# Patient Record
Sex: Female | Born: 1980 | Race: Asian | Hispanic: No | Marital: Married | Smoking: Never smoker
Health system: Southern US, Community
[De-identification: ages and names within clinical notes are randomized; demographics above are authoritative.]

## PROBLEM LIST (undated history)

## (undated) ENCOUNTER — Inpatient Hospital Stay (HOSPITAL_COMMUNITY): Payer: Self-pay

## (undated) DIAGNOSIS — E538 Deficiency of other specified B group vitamins: Secondary | ICD-10-CM

## (undated) DIAGNOSIS — E041 Nontoxic single thyroid nodule: Secondary | ICD-10-CM

## (undated) DIAGNOSIS — E559 Vitamin D deficiency, unspecified: Secondary | ICD-10-CM

## (undated) DIAGNOSIS — D219 Benign neoplasm of connective and other soft tissue, unspecified: Secondary | ICD-10-CM

## (undated) HISTORY — DX: Vitamin D deficiency, unspecified: E55.9

## (undated) HISTORY — PX: TUBAL LIGATION: SHX77

## (undated) HISTORY — DX: Deficiency of other specified B group vitamins: E53.8

## (undated) HISTORY — PX: GUM SURGERY: SHX658

---

## 2010-02-25 ENCOUNTER — Ambulatory Visit: Payer: Self-pay | Admitting: Family Medicine

## 2010-03-01 ENCOUNTER — Ambulatory Visit: Payer: Self-pay | Admitting: Family Medicine

## 2010-03-01 DIAGNOSIS — R635 Abnormal weight gain: Secondary | ICD-10-CM | POA: Insufficient documentation

## 2010-03-01 DIAGNOSIS — M25569 Pain in unspecified knee: Secondary | ICD-10-CM | POA: Insufficient documentation

## 2010-03-01 DIAGNOSIS — M2140 Flat foot [pes planus] (acquired), unspecified foot: Secondary | ICD-10-CM | POA: Insufficient documentation

## 2010-03-11 ENCOUNTER — Encounter: Payer: Self-pay | Admitting: Family Medicine

## 2010-03-11 ENCOUNTER — Ambulatory Visit: Payer: Self-pay | Admitting: Family Medicine

## 2010-03-11 LAB — CONVERTED CEMR LAB

## 2010-03-17 LAB — CONVERTED CEMR LAB
AST: 13 units/L (ref 0–37)
Albumin: 4.3 g/dL (ref 3.5–5.2)
Alkaline Phosphatase: 82 units/L (ref 39–117)
BUN: 8 mg/dL (ref 6–23)
Calcium: 9.2 mg/dL (ref 8.4–10.5)
Chloride: 102 meq/L (ref 96–112)
HDL: 53 mg/dL (ref >39)
LDL Cholesterol: 101 mg/dL — ABNORMAL HIGH (ref 0–99)
Potassium: 4.5 meq/L (ref 3.5–5.3)
Sodium: 137 meq/L (ref 135–145)
TSH: 1.657 microintl units/mL (ref 0.350–4.500)
Total Protein: 7.1 g/dL (ref 6.0–8.3)

## 2010-03-29 ENCOUNTER — Encounter: Payer: Self-pay | Admitting: Family Medicine

## 2010-04-21 NOTE — Assessment & Plan Note (Signed)
Summary: CPE with pap   Vital Signs:  Patient profile:   30 year old female Menstrual status:  regular LMP:     02/16/2010 Height:      64 inches Weight:      171 pounds BMI:     29.46 Pulse rate:   79 / minute BP sitting:   103 / 68  (right arm) Cuff size:   regular  Vitals Entered By: Avon Gully CMA, Duncan Dull) (March 11, 2010 9:34 AM) CC: CPE, and pap LMP (date): 02/16/2010     Menstrual Status regular Enter LMP: 02/16/2010   Primary Care Provider:  Seymour Bars DO  CC:  CPE and and pap.  History of Present Illness: 30 yo F presents for CPE with pap smear.  She is G1P1 with regular menses.  She is married and monogamous.  Due for fasting labs (has order from last visit).  Immunizations are UTD.  She is thinks her Tdap was done in 04.  She is due to see the dentist.  She has chosen to not take anything for birth control and would be fine if she got pregnant.  Not taking any vitamins and is not exercising.  Would like to lose wt.    Allergies: 1)  ! Pcn  Past History:  Past Medical History: Reviewed history from 03/01/2010 and no changes required. G1P1 bilat knee pain  Family History: Reviewed history from 03/01/2010 and no changes required. 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 Married to [employer].  Has a 40 yo daughter. Never smoked. Rare ETOH. No birth control. Just starting to exercise.  Review of Systems      See HPI  Physical Exam  General:  alert, well-developed, well-nourished, and well-hydrated.   Head:  normocephalic and atraumatic.   Eyes:  pupils equal, pupils round, and pupils reactive to light.   Ears:  EACs patent; TMs translucent and gray with good cone of light and bony landmarks.  Nose:  no nasal discharge.   Mouth:  Oral mucosa and oropharynx without lesions or exudates.  Teeth in good repair. Neck:   no masses.   Breasts:  No mass, nodules, thickening, tenderness, bulging, retraction, inflamation, nipple discharge or skin changes noted.   Lungs:  Normal respiratory effort, chest expands symmetrically. Lungs are clear to auscultation, no crackles or wheezes. Heart:  Normal rate and regular rhythm. S1 and S2 normal without gallop, murmur, click, rub or other extra sounds. Abdomen:  soft, non-tender, normal bowel sounds, no distention, no masses, no guarding, no hepatomegaly, and no splenomegaly.   Genitalia:  Pelvic Exam:        External: normal female genitalia without lesions or masses        Vagina: normal without lesions or masses        Cervix: normal without lesions or masses        Adnexa: normal bimanual exam without masses or fullness        Uterus: normal by palpation        Pap smear: performed Pulses:  2+ radial and pedal pulses Extremities:  no LE edema Skin:  color normal.   Cervical Nodes:  No lymphadenopathy noted Psych:  good eye contact, not anxious appearing, and not depressed appearing.     Impression & Recommendations:  Problem # 1:  ROUTINE GYNECOLOGICAL EXAMINATION (ICD-V72.31) Keeping healthy checklist for women  reviewed. BP at goal.  BMI 29= overwt. Work on [employer], increased exercise, wt loss. Add MVI + Calcium with D daily. Declined need for contraception. Thin prep pap done. Immunizations UTD. Labs today.   Patient Instructions: 1)  Update fasting labs downstairs today. 2)  Will call you next wk with pap smear and lab results.   Orders Added: 1)  Est. Patient age 30-39 [phone]

## 2010-04-21 NOTE — Assessment & Plan Note (Signed)
Summary: NOV knee pain   Vital Signs:  Patient profile:   30 year old female Height:      64 inches Weight:      172 pounds BMI:     29.63 O2 Sat:      100 % on Room air Temp:     98.4 degrees F oral Pulse rate:   74 / minute BP sitting:   112 / 78  (left arm) Cuff size:   large  Vitals Entered By: Payton Spark CMA (March 01, 2010 1:51 PM)  O2 Flow:  Room air CC: New to est. C/o knee pain and neck being swollen.    Primary Care Provider:  Seymour Bars DO  CC:  New to est. C/o knee pain and neck being swollen. Marland Kitchen  History of Present Illness: 30 yo [place] Female presents for NOV.  She is a G1P1001 with hx of bilateral knee pain and  ~2-3 UTIs / year.  She did PT in [place] last year but her knee pain did not improve.  She then saw ortho and had normal XRAYs.  Is not taking anything for pain and wants to start exercising more to get back to her pre-baby weight of 145.      Current Medications (verified): 1)  None  Allergies (verified): 1)  ! 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 Marrie dto Raghi.  Has a 60 yo daughter. Never smoked. Rare ETOH. No birth control. Just starting to exercise.  Review of Systems       no fevers/sweats/weakness, unexplained wt loss/gain, no change in vision, no difficulty hearing, ringing in ears, no hay fever/allergies, no CP/discomfort, no palpitations, no breast lump/nipple discharge, no cough/wheeze, no blood in stool, no N/V/D, no nocturia, no leaking urine, no unusual vag bleeding, no vaginal/penile discharge, + muscle/joint pain, no rash, no new/changing mole, no HA, no memory loss, no anxiety, no sleep problem, no depression, no unexplained lumps, no easy bruising/bleeding, + concern with sexual function  Physical Exam  General:  alert,  well-developed, well-nourished, well-hydrated, and overweight-appearing.   Head:  normocephalic and atraumatic.   Eyes:  pupils equal, pupils round, and pupils reactive to light.   Mouth:  pharynx pink and moist.   Neck:  no masses.  slightly enlarged thyroid gland w/o palpable nodules, tenderness or bruits - moves freely with swallowing Lungs:  Normal respiratory effort, chest expands symmetrically. Lungs are clear to auscultation, no crackles or wheezes. Heart:  Normal rate and regular rhythm. S1 and S2 normal without gallop, murmur, click, rub or other extra sounds. Msk:  no knee effusions  Extremities:  bilat pes planus with over pronation Skin:  color normal.   Psych:  good eye contact, not anxious appearing, and not depressed appearing.     Impression & Recommendations:  Problem # 1:  KNEE PAIN, BILATERAL (ICD-719.46) Likely functional knee pain, bilat but L>R due to pes planus with overpronation and medial stress on the knees. Advised treatment with OTC shoe inserts for improved arch support, proper footwear.  Ice knee after exercise. Wt loss is likely to help long term.  Will recheck at her CPE.  Problem # 2:  WEIGHT GAIN (ICD-783.1) Improving, 30 mos postpartum with pre- pregnancy wt of 145. Will check TSH with fasting labs. Plans to start  working out at [employer] Orders: T-TSH [phone])  Other Orders: T-Comprehensive Metabolic Panel [phone]) T-Lipid Profile [phone])  Patient Instructions: 1)  Try Dr Brett Canales shoe inserts for over pronators. 2)  Wear shoes with good arch support. 3)  Ice knees x 15 min after exercise. 4)  Update fasting labs downstairs. 5)  Will call you w/ results. 6)  Return for PHYSICAL with PAP smear in 3 mos.   Orders Added: 1)  T-TSH [[id number]] 2)  T-Comprehensive Metabolic Panel [[id number]] 3)  T-Lipid Profile [[id number]] 4)  New Patient Level III [88416]

## 2010-04-21 NOTE — Miscellaneous (Signed)
Summary: pap smear  Clinical Lists Changes  Observations: Added new observation of PAP SMEAR:  Specimen Adequacy: Satisfactory for evaluation.   Ancillary Testing: NO HR HPV present.  Squamous Cell: Atypical Squamous Cells of undetermined significance (ASC-US).   (03/11/2010 15:25)      Pap Smear  Procedure date:  03/11/2010  Findings:       Specimen Adequacy: Satisfactory for evaluation.   Ancillary Testing: NO HR HPV present.  Squamous Cell: Atypical Squamous Cells of undetermined significance (ASC-US).     Pap Smear  Procedure date:  03/11/2010  Findings:       Specimen Adequacy: Satisfactory for evaluation.   Ancillary Testing: NO HR HPV present.  Squamous Cell: Atypical Squamous Cells of undetermined significance (ASC-US).    Appended Document: pap smear Pls let pt know that her pap smear came back "ASCUS' - slightly abnormal but further testing was negative for the high risk HPV virus, so will plan to repeat in 1 yr.  Seymour Bars, D.O.  Appended Document: pap smear 03/29/2010 @ 3:40pm- LM to return call. KJ LPN  Appended Document: pap smear Pt aware of the above

## 2011-07-17 ENCOUNTER — Other Ambulatory Visit: Payer: Self-pay | Admitting: Endocrinology

## 2011-07-17 DIAGNOSIS — E041 Nontoxic single thyroid nodule: Secondary | ICD-10-CM

## 2011-07-25 ENCOUNTER — Inpatient Hospital Stay
Admission: RE | Admit: 2011-07-25 | Discharge: 2011-07-25 | Payer: Self-pay | Source: Ambulatory Visit | Attending: Endocrinology | Admitting: Endocrinology

## 2011-07-27 ENCOUNTER — Ambulatory Visit
Admission: RE | Admit: 2011-07-27 | Discharge: 2011-07-27 | Disposition: A | Discharge status: Home / Self Care | Payer: Managed Care, Other (non HMO) | Source: Ambulatory Visit | Attending: Endocrinology | Admitting: Endocrinology

## 2011-07-27 ENCOUNTER — Other Ambulatory Visit (HOSPITAL_COMMUNITY)
Admission: RE | Admit: 2011-07-27 | Discharge: 2011-07-27 | Disposition: A | Discharge status: Home / Self Care | Payer: Managed Care, Other (non HMO) | Source: Ambulatory Visit | Attending: Endocrinology | Admitting: Endocrinology

## 2011-07-27 DIAGNOSIS — E049 Nontoxic goiter, unspecified: Secondary | ICD-10-CM | POA: Insufficient documentation

## 2011-07-27 DIAGNOSIS — E041 Nontoxic single thyroid nodule: Secondary | ICD-10-CM

## 2011-11-02 LAB — OB RESULTS CONSOLE RPR: RPR: NONREACTIVE

## 2011-11-02 LAB — OB RESULTS CONSOLE ANTIBODY SCREEN: Antibody Screen: NEGATIVE

## 2011-11-02 LAB — OB RESULTS CONSOLE GC/CHLAMYDIA: Chlamydia: NEGATIVE

## 2012-01-11 ENCOUNTER — Encounter (HOSPITAL_COMMUNITY): Payer: Self-pay | Admitting: *Deleted

## 2012-01-11 ENCOUNTER — Inpatient Hospital Stay (HOSPITAL_COMMUNITY)
Admission: AD | Admit: 2012-01-11 | Discharge: 2012-01-12 | Disposition: A | Discharge status: Home / Self Care | Payer: Managed Care, Other (non HMO) | Source: Ambulatory Visit | Attending: Obstetrics and Gynecology | Admitting: Obstetrics and Gynecology

## 2012-01-11 DIAGNOSIS — K529 Noninfective gastroenteritis and colitis, unspecified: Secondary | ICD-10-CM

## 2012-01-11 DIAGNOSIS — R197 Diarrhea, unspecified: Secondary | ICD-10-CM | POA: Insufficient documentation

## 2012-01-11 DIAGNOSIS — O99891 Other specified diseases and conditions complicating pregnancy: Secondary | ICD-10-CM | POA: Insufficient documentation

## 2012-01-11 DIAGNOSIS — K5289 Other specified noninfective gastroenteritis and colitis: Secondary | ICD-10-CM | POA: Insufficient documentation

## 2012-01-11 DIAGNOSIS — R109 Unspecified abdominal pain: Secondary | ICD-10-CM | POA: Insufficient documentation

## 2012-01-11 LAB — URINALYSIS, ROUTINE W REFLEX MICROSCOPIC
Hgb urine dipstick: NEGATIVE
Leukocytes, UA: NEGATIVE
Nitrite: NEGATIVE
Protein, ur: NEGATIVE mg/dL
Specific Gravity, Urine: <1.005 — ABNORMAL LOW (ref 1.005–1.030)
Urobilinogen, UA: 0.2 mg/dL (ref 0.0–1.0)

## 2012-01-11 NOTE — MAU Note (Signed)
Diarrhea with mucus in the stool. Back pain and discomfort in lower abdomen.

## 2012-01-11 NOTE — MAU Provider Note (Signed)
Chief Complaint: Diarrhea and Back Pain  First Provider Initiated Contact with Patient 01/11/12 2348      SUBJECTIVE HPI: Kelly Ramsey is a 31 y.o. G2P1001 at [redacted]w[redacted]d by LMP who presents with 4 BMs w/ mucus in the past 24 hours and mild cramping. Denies fever, chills, N/V, loose or watery stools, VB, LOF. Asking if she can take Imodium.   History reviewed. No pertinent past medical history. OB History    Grav Para Term Preterm Abortions TAB SAB Ect Mult Living   2 1 1       1      # Outc Date GA Lbr Len/2nd Wgt Sex Del Anes PTL Lv   1 TRM      LTCS      Comments: Breech   2 CUR              Past Surgical History  Procedure Date  . Cesarean section   . Gum surgery    History   Social History  . Marital Status: Married    Spouse Name: N/A    Number of Children: N/A  . Years of Education: N/A   Occupational History  . Not on file.   Social History Main Topics  . Smoking status: Never Smoker   . Smokeless tobacco: Not on file  . Alcohol Use: No  . Drug Use: No  . Sexually Active: Not Currently   Other Topics Concern  . Not on file   Social History Narrative  . No narrative on file   No current facility-administered medications on file prior to encounter.   No current outpatient prescriptions on file prior to encounter.   Allergies  Allergen Reactions  . Penicillins Rash    ROS: Pertinent items in HPI  OBJECTIVE Temperature 97.9 F (36.6 C), resp. rate 18, height 5\' 4"  (1.626 m), weight 79.833 kg (176 lb). GENERAL: Well-developed, well-nourished female in no acute distress. Anxious. HEENT: Normocephalic HEART: normal rate RESP: normal effort ABDOMEN: Soft, non-tender. EXTREMITIES: Nontender, no edema NEURO: Alert and oriented SPECULUM EXAM: deferred BIMANUAL: cervix long and closed; uterus 18-week-size.  LAB RESULTS Results for orders placed during the hospital encounter of 01/11/12 (from the past 24 hour(s))  URINALYSIS, ROUTINE W REFLEX MICROSCOPIC      Status: Abnormal   Collection Time   01/11/12 11:15 PM      Component Value Range   Color, Urine YELLOW  YELLOW   APPearance CLEAR  CLEAR   Specific Gravity, Urine <1.005 (*) 1.005 - 1.030   pH 5.5  5.0 - 8.0   Glucose, UA NEGATIVE  NEGATIVE mg/dL   Hgb urine dipstick NEGATIVE  NEGATIVE   Bilirubin Urine NEGATIVE  NEGATIVE   Ketones, ur NEGATIVE  NEGATIVE mg/dL   Protein, ur NEGATIVE  NEGATIVE mg/dL   Urobilinogen, UA 0.2  0.0 - 1.0 mg/dL   Nitrite NEGATIVE  NEGATIVE   Leukocytes, UA NEGATIVE  NEGATIVE   IMAGING No results found.  MAU COURSE  ASSESSMENT 1. Gastroenteritis, acute    PLAN Discharge home per consult w Dr. Vincente Poli. May take Imodium.  Call office if no improvement in 72 hours.  Follow-up Information    Follow up with ADKINS,GRETCHEN, MD. On 01/12/2012. (as scheduled)    Contact information:   [address] [place], [address] [place] [address] [phone]       Follow up with THE [facility] [place] MATERNITY ADMISSIONS. (As needed if symptoms worsen)    Contact information:   [address] [phone] [address] [place] [address] [phone]          Medication List     As of 01/12/2012  5:10 AM    TAKE these medications         multivitamin with minerals tablet   Take 1 tablet by mouth daily.        [place], CNM 01/11/2012  11:41 PM

## 2012-01-12 DIAGNOSIS — K5289 Other specified noninfective gastroenteritis and colitis: Secondary | ICD-10-CM

## 2012-03-20 NOTE — L&D Delivery Note (Signed)
SVD of VMI at 0813 wt and pH pending; APGARs 9,9.  EBL 300cc. Head delivered LOA with mouth and nose bulb suctioned on perineum.  Body delivered atraumatically.  Cord clamped, cut and baby to abdomen.  Cord pH obtained.  Placenta delivered S/I/3VC with marginal cord insertion noted.  Fundus firmed with pitocin and massage.  2nd degree perineal laceration repaired in the normal fashion with 2-0 Rapide.  Mom and baby stable.  Baby noted to have absent left fifth digit.  Peds to evaluate.    Mitchel Honour, DO

## 2012-05-15 LAB — OB RESULTS CONSOLE GBS: GBS: POSITIVE

## 2012-05-28 ENCOUNTER — Encounter (HOSPITAL_COMMUNITY): Payer: Self-pay | Admitting: Pharmacist

## 2012-06-04 ENCOUNTER — Inpatient Hospital Stay (HOSPITAL_COMMUNITY): Payer: Managed Care, Other (non HMO)

## 2012-06-04 ENCOUNTER — Encounter (HOSPITAL_COMMUNITY): Payer: Self-pay

## 2012-06-04 ENCOUNTER — Inpatient Hospital Stay (HOSPITAL_COMMUNITY)
Admission: AD | Admit: 2012-06-04 | Discharge: 2012-06-05 | Disposition: A | Discharge status: Home / Self Care | Payer: Managed Care, Other (non HMO) | Source: Ambulatory Visit | Attending: Obstetrics and Gynecology | Admitting: Obstetrics and Gynecology

## 2012-06-04 DIAGNOSIS — O26859 Spotting complicating pregnancy, unspecified trimester: Secondary | ICD-10-CM | POA: Insufficient documentation

## 2012-06-04 HISTORY — DX: Nontoxic single thyroid nodule: E04.1

## 2012-06-04 HISTORY — DX: Benign neoplasm of connective and other soft tissue, unspecified: D21.9

## 2012-06-04 NOTE — Progress Notes (Signed)
Dr notified of patient, who present with c/o light vaginal bleeding and possibility of having placenta previa or placenta on "edge". No mention of placenta previa or low lying placenta in prenatal record. Order to obtain limited ultrasound, if no previa perform a cervical exam.

## 2012-06-07 ENCOUNTER — Encounter (HOSPITAL_COMMUNITY): Payer: Self-pay

## 2012-06-09 ENCOUNTER — Encounter (HOSPITAL_COMMUNITY): Payer: Self-pay | Admitting: Obstetrics

## 2012-06-09 ENCOUNTER — Inpatient Hospital Stay (HOSPITAL_COMMUNITY)
Admission: AD | Admit: 2012-06-09 | Discharge: 2012-06-12 | DRG: 775 | Disposition: A | Discharge status: Home / Self Care | Payer: Managed Care, Other (non HMO) | Source: Ambulatory Visit | Attending: Obstetrics and Gynecology | Admitting: Obstetrics and Gynecology

## 2012-06-09 DIAGNOSIS — O34599 Maternal care for other abnormalities of gravid uterus, unspecified trimester: Secondary | ICD-10-CM | POA: Diagnosis present

## 2012-06-09 DIAGNOSIS — E041 Nontoxic single thyroid nodule: Secondary | ICD-10-CM | POA: Diagnosis present

## 2012-06-09 DIAGNOSIS — R635 Abnormal weight gain: Secondary | ICD-10-CM

## 2012-06-09 DIAGNOSIS — D259 Leiomyoma of uterus, unspecified: Secondary | ICD-10-CM | POA: Diagnosis present

## 2012-06-09 DIAGNOSIS — E079 Disorder of thyroid, unspecified: Secondary | ICD-10-CM | POA: Diagnosis present

## 2012-06-09 DIAGNOSIS — O99284 Endocrine, nutritional and metabolic diseases complicating childbirth: Secondary | ICD-10-CM | POA: Diagnosis present

## 2012-06-09 DIAGNOSIS — O2650 Maternal hypotension syndrome, unspecified trimester: Secondary | ICD-10-CM | POA: Diagnosis not present

## 2012-06-09 DIAGNOSIS — D4959 Neoplasm of unspecified behavior of other genitourinary organ: Secondary | ICD-10-CM | POA: Diagnosis present

## 2012-06-09 DIAGNOSIS — M2140 Flat foot [pes planus] (acquired), unspecified foot: Secondary | ICD-10-CM

## 2012-06-09 DIAGNOSIS — O34219 Maternal care for unspecified type scar from previous cesarean delivery: Secondary | ICD-10-CM | POA: Diagnosis present

## 2012-06-09 LAB — TYPE AND SCREEN
ABO/RH(D): B POS
Antibody Screen: NEGATIVE

## 2012-06-09 LAB — CBC
Hemoglobin: 12 g/dL (ref 12.0–15.0)
MCV: 91.6 fL (ref 78.0–100.0)
Platelets: 177 10*3/uL (ref 150–400)
RBC: 3.91 MIL/uL (ref 3.87–5.11)
WBC: 9.5 10*3/uL (ref 4.0–10.5)

## 2012-06-09 MED ORDER — OXYCODONE-ACETAMINOPHEN 5-325 MG PO TABS: 1.0000 | ORAL_TABLET | ORAL | Status: DC | PRN

## 2012-06-09 MED ORDER — TERBUTALINE SULFATE 1 MG/ML IJ SOLN: 0.2500 mg | Freq: Once | INTRAMUSCULAR | Status: AC | PRN

## 2012-06-09 MED ORDER — FLEET ENEMA 7-19 GM/118ML RE ENEM: 1.0000 | ENEMA | RECTAL | Status: DC | PRN

## 2012-06-09 MED ORDER — ACETAMINOPHEN 325 MG PO TABS: 650.0000 mg | ORAL_TABLET | ORAL | Status: DC | PRN

## 2012-06-09 MED ORDER — LACTATED RINGERS IV SOLN: 500.0000 mL | INTRAVENOUS | Status: DC | PRN

## 2012-06-09 MED ORDER — CITRIC ACID-SODIUM CITRATE 334-500 MG/5ML PO SOLN: 30.0000 mL | ORAL | Status: DC | PRN

## 2012-06-09 MED ORDER — ONDANSETRON HCL 4 MG/2ML IJ SOLN: 4.0000 mg | Freq: Four times a day (QID) | INTRAMUSCULAR | Status: DC | PRN

## 2012-06-09 MED ORDER — OXYTOCIN BOLUS FROM INFUSION
500.0000 mL | INTRAVENOUS | Status: DC
  Administered 2012-06-10: 500 mL via INTRAVENOUS

## 2012-06-09 MED ORDER — BUTORPHANOL TARTRATE 1 MG/ML IJ SOLN
2.0000 mg | INTRAMUSCULAR | Status: DC | PRN
  Administered 2012-06-09: 2 mg via INTRAVENOUS
  Filled 2012-06-09: qty 2

## 2012-06-09 MED ORDER — OXYTOCIN 40 UNITS IN LACTATED RINGERS INFUSION - SIMPLE MED: 62.5000 mL/h | INTRAVENOUS | Status: DC

## 2012-06-09 MED ORDER — LACTATED RINGERS IV SOLN
INTRAVENOUS | Status: DC
  Administered 2012-06-09 – 2012-06-10 (×2): via INTRAVENOUS

## 2012-06-09 MED ORDER — OXYTOCIN 40 UNITS IN LACTATED RINGERS INFUSION - SIMPLE MED
1.0000 m[IU]/min | INTRAVENOUS | Status: DC
  Administered 2012-06-09: 1 m[IU]/min via INTRAVENOUS
  Administered 2012-06-10: 10 m[IU]/min via INTRAVENOUS
  Filled 2012-06-09: qty 1000

## 2012-06-09 MED ORDER — CLINDAMYCIN PHOSPHATE 900 MG/50ML IV SOLN
900.0000 mg | Freq: Three times a day (TID) | INTRAVENOUS | Status: DC
  Administered 2012-06-09 – 2012-06-10 (×2): 900 mg via INTRAVENOUS
  Filled 2012-06-09 (×3): qty 50

## 2012-06-09 MED ORDER — SODIUM CHLORIDE 0.9 % IV SOLN: 2.0000 g | Freq: Four times a day (QID) | INTRAVENOUS | Status: DC

## 2012-06-09 MED ORDER — IBUPROFEN 600 MG PO TABS: 600.0000 mg | ORAL_TABLET | Freq: Four times a day (QID) | ORAL | Status: DC | PRN

## 2012-06-09 MED ORDER — LIDOCAINE HCL (PF) 1 % IJ SOLN
30.0000 mL | INTRAMUSCULAR | Status: DC | PRN
  Filled 2012-06-09: qty 30

## 2012-06-09 NOTE — H&P (Signed)
Kelly Ramsey is a 32 y.o. female presenting for SROM about 7 pm. Some UCs. No HA, no vision change, no epigastric pain. Pregnancy complicated by marginal insertion of umbilical cord, maternal thyroid nodules without meds, uterine fibroids and history of C/S for breech.  Maternal Medical History:  Reason for admission: Rupture of membranes.   Contractions: Onset was 3-5 hours ago.   Frequency: irregular.    Fetal activity: Perceived fetal activity is normal.      OB History   Grav Para Term Preterm Abortions TAB SAB Ect Mult Living   2 1 1       1      Past Medical History  Diagnosis Date  . Fibroids   . Thyroid nodule    Past Surgical History  Procedure Laterality Date  . Cesarean section    . Gum surgery     Family History: family history is not on file. Social History:  reports that she has never smoked. She does not have any smokeless tobacco history on file. She reports that she does not drink alcohol or use illicit drugs.   Prenatal Transfer Tool  Maternal Diabetes: No Genetic Screening: Normal Maternal Ultrasounds/Referrals: Abnormal:  Findings:   Other:mild fetal renal pyelectasis Fetal Ultrasounds or other Referrals:  None Maternal Substance Abuse:  No Significant Maternal Medications:  None Significant Maternal Lab Results:  None Other Comments:  None  Review of Systems  Eyes: Negative for blurred vision.  Gastrointestinal: Negative for abdominal pain.  Neurological: Negative for headaches.    Dilation: 3.5 Effacement (%): 50 Station: -3 Exam by:: Dr Henderson Cloud Blood pressure 100/70, pulse 82, temperature 98.5 F (36.9 C), temperature source Oral, resp. rate 20, height 5\' 4"  (1.626 m), weight 200 lb (90.719 kg), SpO2 100.00%. Maternal Exam:  Uterine Assessment: Contraction strength is moderate.  Contraction frequency is irregular.   Abdomen: Surgical scars: low transverse.   Fetal presentation: vertex     Fetal Exam Fetal Monitor Review: Pattern:  accelerations present.       Physical Exam  Cardiovascular: Normal rate and regular rhythm.   Respiratory: Effort normal and breath sounds normal.  GI: There is no tenderness.  Neurological: She has normal reflexes.    Prenatal labs: ABO, Rh: --/--/B POS (03/23 2110) Antibody: NEG (03/23 2110) Rubella: Immune (08/15 0000) RPR: Nonreactive (08/15 0000)  HBsAg: Negative (08/15 0000)  HIV: Non-reactive (08/15 0000)  GBS: Positive (02/26 0000)   Assessment/Plan: 32 yo G2P1 at 64 6/7 weeks with SROM and hx of C/S for breech. D/W patient and husband risks of VBAC including uterine scar separation, life threatening bleeding for mother or baby, transfusion of blood products-HIV/Hep, hysterectomy. D/W risks of C/S including infection, bleeding/transfusion-HIV/Hep, organ damage, DVT/PE, pneumonia.  All questions answered. Will begin low dose pitocin-D/W risks   Loucille Takach II,Traniece Boffa E 06/09/2012, 10:41 PM

## 2012-06-09 NOTE — MAU Note (Signed)
Note small amt of blood tinged fluid leaking out-unable to see a fern

## 2012-06-09 NOTE — MAU Note (Signed)
Pt states she had a gush of fluid at 1900-blood tinged in color-not leaking at present

## 2012-06-10 ENCOUNTER — Inpatient Hospital Stay (HOSPITAL_COMMUNITY)
Admission: RE | Admit: 2012-06-10 | Discharge: 2012-06-10 | Disposition: A | Discharge status: Home / Self Care | Payer: Managed Care, Other (non HMO) | Source: Ambulatory Visit

## 2012-06-10 ENCOUNTER — Inpatient Hospital Stay (HOSPITAL_COMMUNITY): Payer: Managed Care, Other (non HMO) | Admitting: Anesthesiology

## 2012-06-10 ENCOUNTER — Encounter (HOSPITAL_COMMUNITY): Payer: Self-pay | Admitting: Anesthesiology

## 2012-06-10 LAB — ABO/RH: ABO/RH(D): B POS

## 2012-06-10 LAB — RPR: RPR Ser Ql: NONREACTIVE

## 2012-06-10 MED ORDER — SENNOSIDES-DOCUSATE SODIUM 8.6-50 MG PO TABS
2.0000 | ORAL_TABLET | Freq: Every day | ORAL | Status: DC
  Administered 2012-06-10 – 2012-06-11 (×2): 2 via ORAL

## 2012-06-10 MED ORDER — LIDOCAINE HCL (PF) 1 % IJ SOLN
INTRAMUSCULAR | Status: DC | PRN
  Administered 2012-06-10 (×2): 4 mL

## 2012-06-10 MED ORDER — DIPHENHYDRAMINE HCL 25 MG PO CAPS: 25.0000 mg | ORAL_CAPSULE | Freq: Four times a day (QID) | ORAL | Status: DC | PRN

## 2012-06-10 MED ORDER — LANOLIN HYDROUS EX OINT: TOPICAL_OINTMENT | CUTANEOUS | Status: DC | PRN

## 2012-06-10 MED ORDER — DIPHENHYDRAMINE HCL 50 MG/ML IJ SOLN: 12.5000 mg | INTRAMUSCULAR | Status: DC | PRN

## 2012-06-10 MED ORDER — EPHEDRINE 5 MG/ML INJ
10.0000 mg | INTRAVENOUS | Status: DC | PRN
  Filled 2012-06-10: qty 2

## 2012-06-10 MED ORDER — PHENYLEPHRINE 40 MCG/ML (10ML) SYRINGE FOR IV PUSH (FOR BLOOD PRESSURE SUPPORT)
80.0000 ug | PREFILLED_SYRINGE | INTRAVENOUS | Status: DC | PRN
  Filled 2012-06-10: qty 2

## 2012-06-10 MED ORDER — DIBUCAINE 1 % RE OINT
1.0000 "application " | TOPICAL_OINTMENT | RECTAL | Status: DC | PRN
  Administered 2012-06-11: 1 via RECTAL
  Filled 2012-06-10: qty 28

## 2012-06-10 MED ORDER — OXYCODONE-ACETAMINOPHEN 5-325 MG PO TABS
1.0000 | ORAL_TABLET | ORAL | Status: DC | PRN
  Administered 2012-06-11 (×2): 1 via ORAL
  Filled 2012-06-10 (×2): qty 1
  Filled 2012-06-10: qty 2

## 2012-06-10 MED ORDER — PRENATAL MULTIVITAMIN CH
1.0000 | ORAL_TABLET | Freq: Every day | ORAL | Status: DC
  Administered 2012-06-10 – 2012-06-11 (×2): 1 via ORAL
  Filled 2012-06-10 (×2): qty 1

## 2012-06-10 MED ORDER — PHENYLEPHRINE 40 MCG/ML (10ML) SYRINGE FOR IV PUSH (FOR BLOOD PRESSURE SUPPORT)
80.0000 ug | PREFILLED_SYRINGE | INTRAVENOUS | Status: DC | PRN
  Filled 2012-06-10: qty 2
  Filled 2012-06-10: qty 5

## 2012-06-10 MED ORDER — ONDANSETRON HCL 4 MG/2ML IJ SOLN: 4.0000 mg | INTRAMUSCULAR | Status: DC | PRN

## 2012-06-10 MED ORDER — FENTANYL 2.5 MCG/ML BUPIVACAINE 1/10 % EPIDURAL INFUSION (WH - ANES)
14.0000 mL/h | INTRAMUSCULAR | Status: DC | PRN
  Filled 2012-06-10 (×2): qty 125

## 2012-06-10 MED ORDER — IBUPROFEN 600 MG PO TABS
600.0000 mg | ORAL_TABLET | Freq: Four times a day (QID) | ORAL | Status: DC
  Administered 2012-06-10 – 2012-06-12 (×8): 600 mg via ORAL
  Filled 2012-06-10 (×8): qty 1

## 2012-06-10 MED ORDER — BENZOCAINE-MENTHOL 20-0.5 % EX AERO
1.0000 "application " | INHALATION_SPRAY | CUTANEOUS | Status: DC | PRN
  Administered 2012-06-10 – 2012-06-12 (×2): 1 via TOPICAL
  Filled 2012-06-10 (×2): qty 56

## 2012-06-10 MED ORDER — FENTANYL 2.5 MCG/ML BUPIVACAINE 1/10 % EPIDURAL INFUSION (WH - ANES)
INTRAMUSCULAR | Status: DC | PRN
  Administered 2012-06-10: 14 mL/h via EPIDURAL

## 2012-06-10 MED ORDER — LACTATED RINGERS IV SOLN
500.0000 mL | Freq: Once | INTRAVENOUS | Status: AC
  Administered 2012-06-10: 500 mL via INTRAVENOUS

## 2012-06-10 MED ORDER — ONDANSETRON HCL 4 MG PO TABS: 4.0000 mg | ORAL_TABLET | ORAL | Status: DC | PRN

## 2012-06-10 MED ORDER — WITCH HAZEL-GLYCERIN EX PADS
1.0000 "application " | MEDICATED_PAD | CUTANEOUS | Status: DC | PRN
  Administered 2012-06-11 – 2012-06-12 (×2): 1 via TOPICAL

## 2012-06-10 MED ORDER — FENTANYL 2.5 MCG/ML BUPIVACAINE 1/10 % EPIDURAL INFUSION (WH - ANES)
14.0000 mL/h | INTRAMUSCULAR | Status: DC | PRN
  Administered 2012-06-10 (×2): 14 mL/h via EPIDURAL

## 2012-06-10 MED ORDER — EPHEDRINE 5 MG/ML INJ
10.0000 mg | INTRAVENOUS | Status: DC | PRN
  Filled 2012-06-10: qty 4
  Filled 2012-06-10: qty 2

## 2012-06-10 MED ORDER — SIMETHICONE 80 MG PO CHEW: 80.0000 mg | CHEWABLE_TABLET | ORAL | Status: DC | PRN

## 2012-06-10 MED ORDER — TETANUS-DIPHTH-ACELL PERTUSSIS 5-2.5-18.5 LF-MCG/0.5 IM SUSP: 0.5000 mL | Freq: Once | INTRAMUSCULAR | Status: DC

## 2012-06-10 MED ORDER — ZOLPIDEM TARTRATE 5 MG PO TABS: 5.0000 mg | ORAL_TABLET | Freq: Every evening | ORAL | Status: DC | PRN

## 2012-06-10 NOTE — Anesthesia Preprocedure Evaluation (Signed)
Anesthesia Evaluation  Patient identified by MRN, date of birth, ID band Patient awake    Reviewed: Allergy & Precautions, H&P , Patient's Chart, lab work & pertinent test results  Airway Mallampati: III TM Distance: >3 FB Neck ROM: Full    Dental no notable dental hx. (+) Teeth Intact   Pulmonary neg pulmonary ROS,  breath sounds clear to auscultation  Pulmonary exam normal       Cardiovascular negative cardio ROS  Rhythm:Regular Rate:Normal     Neuro/Psych negative neurological ROS  negative psych ROS   GI/Hepatic negative GI ROS, Neg liver ROS,   Endo/Other  Thyroid nodule  Renal/GU negative Renal ROS  negative genitourinary   Musculoskeletal   Abdominal (+) + obese,   Peds  Hematology negative hematology ROS (+)   Anesthesia Other Findings   Reproductive/Obstetrics (+) Pregnancy Previous C/Section Fibroid uterus                           Anesthesia Physical Anesthesia Plan  ASA: II  Anesthesia Plan: Epidural   Post-op Pain Management:    Induction:   Airway Management Planned: Natural Airway  Additional Equipment:   Intra-op Plan:   Post-operative Plan:   Informed Consent: I have reviewed the patients History and Physical, chart, labs and discussed the procedure including the risks, benefits and alternatives for the proposed anesthesia with the patient or authorized representative who has indicated his/her understanding and acceptance.   Dental advisory given  Plan Discussed with: CRNA, Surgeon and Anesthesiologist  Anesthesia Plan Comments:         Anesthesia Quick Evaluation

## 2012-06-10 NOTE — Anesthesia Procedure Notes (Signed)
Epidural Patient location during procedure: OB Start time: 06/10/2012 12:41 AM  Staffing Anesthesiologist: Elodia Haviland A. Performed by: anesthesiologist   Preanesthetic Checklist Completed: patient identified, site marked, surgical consent, pre-op evaluation, timeout performed, IV checked, risks and benefits discussed and monitors and equipment checked  Epidural Patient position: sitting Prep: site prepped and draped and DuraPrep Patient monitoring: continuous pulse ox and blood pressure Approach: midline Injection technique: LOR air  Needle:  Needle type: Tuohy  Needle gauge: 17 G Needle length: 9 cm and 9 Needle insertion depth: 5 cm cm Catheter type: closed end flexible Catheter size: 19 Gauge Catheter at skin depth: 10 cm Test dose: negative and Other  Assessment Events: blood not aspirated, injection not painful, no injection resistance, negative IV test and no paresthesia  Additional Notes Patient identified. Risks and benefits discussed including failed block, incomplete  Pain control, post dural puncture headache, nerve damage, paralysis, blood pressure Changes, nausea, vomiting, reactions to medications-both toxic and allergic and post Partum back pain. All questions were answered. Patient expressed understanding and wished to proceed. Sterile technique was used throughout procedure. Epidural site was Dressed with sterile barrier dressing. No paresthesias, signs of intravascular injection Or signs of intrathecal spread were encountered.  Patient was more comfortable after the epidural was dosed. Please see RN's note for documentation of vital signs and FHR which are stable.

## 2012-06-10 NOTE — Anesthesia Postprocedure Evaluation (Signed)
  Anesthesia Post-op Note  Patient: Kelly Ramsey  Procedure(s) Performed: * No procedures listed *  Patient Location: PACU and Mother/Baby  Anesthesia Type:Epidural  Level of Consciousness: awake, alert  and oriented  Airway and Oxygen Therapy: Patient Spontanous Breathing  Post-op Pain: none  Post-op Assessment: Post-op Vital signs reviewed, Patient's Cardiovascular Status Stable, No headache, No backache, No residual numbness and No residual motor weakness  Post-op Vital Signs: Reviewed and stable  Complications: No apparent anesthesia complications

## 2012-06-10 NOTE — Progress Notes (Signed)
Up to bathroom with RN assist on stedy. Pt fainted on toilet while trying to void. Came to with ammonia and able to void. Back to bed on stedy and felt faint and nauseous with dry heaves on way back to bed. Vital sign and bleeding WNL. Feeling better and sitting up eating lunch. Instructed to not get out of bed without assist from staff.

## 2012-06-11 LAB — CBC
Platelets: 137 10*3/uL — ABNORMAL LOW (ref 150–400)
RBC: 3.35 MIL/uL — ABNORMAL LOW (ref 3.87–5.11)
WBC: 11.4 10*3/uL — ABNORMAL HIGH (ref 4.0–10.5)

## 2012-06-11 NOTE — Progress Notes (Signed)
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 rate 18, height 5\' 4"  (1.626 m), weight 90.719 kg (200 lb), SpO2 100.00%, unknown if currently breastfeeding.  Physical Exam:  General: alert, cooperative, appears stated age and no distress Lochia: appropriate Uterine Fundus: firm Incision:  DVT Evaluation: No evidence of DVT seen on physical exam.   Recent Labs  06/09/12 2110 06/11/12 0615  HGB 12.0 10.2*  HCT 35.8* 31.3*    Assessment/Plan: Plan for discharge tomorrow, Breastfeeding and Circumcision prior to discharge   LOS: 2 days   Kelly Ramsey C 06/11/2012, 10:10 AM

## 2012-06-12 ENCOUNTER — Encounter (HOSPITAL_COMMUNITY): Admission: RE | Payer: Self-pay | Source: Ambulatory Visit

## 2012-06-12 ENCOUNTER — Inpatient Hospital Stay (HOSPITAL_COMMUNITY)
Admission: RE | Admit: 2012-06-12 | Payer: Managed Care, Other (non HMO) | Source: Ambulatory Visit | Admitting: Obstetrics and Gynecology

## 2012-06-12 SURGERY — Surgical Case
Anesthesia: Regional

## 2012-06-12 MED ORDER — HYDROCORTISONE ACE-PRAMOXINE 1-1 % RE FOAM: 1.0000 | Freq: Two times a day (BID) | RECTAL | Status: DC

## 2012-06-12 MED ORDER — IBUPROFEN 600 MG PO TABS: 600.0000 mg | ORAL_TABLET | Freq: Four times a day (QID) | ORAL | Status: DC

## 2012-06-12 NOTE — Progress Notes (Signed)
Post Partum Day 2 Subjective: no complaints  Objective: Blood pressure 94/62, pulse 71, temperature 97.9 F (36.6 C), temperature source Oral, resp. rate 18, height 5\' 4"  (1.626 m), weight 90.719 kg (200 lb), SpO2 100.00%, unknown if currently breastfeeding.  Physical Exam:  General: alert, cooperative and appears stated age Lochia: appropriate Uterine Fundus: firm Incision: healing well DVT Evaluation: No evidence of DVT seen on physical exam.   Recent Labs  06/09/12 2110 06/11/12 0615  HGB 12.0 10.2*  HCT 35.8* 31.3*    Assessment/Plan: Discharge home and Breastfeeding   LOS: 3 days   Kelly Ramsey L 06/12/2012, 8:07 AM

## 2012-06-12 NOTE — Discharge Summary (Signed)
Obstetric Discharge Summary Reason for Admission: onset of labor Prenatal Procedures: none Intrapartum Procedures: spontaneous vaginal delivery Postpartum Procedures: none Complications-Operative and Postpartum: 2nd degree perineal laceration Hemoglobin  Date Value Range Status  06/11/2012 10.2* 12.0 - 15.0 g/dL Final     HCT  Date Value Range Status  06/11/2012 31.3* 36.0 - 46.0 % Final    Physical Exam:  General: alert Lochia: appropriate Uterine Fundus: firm Incision: healing well DVT Evaluation: No evidence of DVT seen on physical exam.  Discharge Diagnoses: Term Pregnancy-delivered  Discharge Information: Date: 06/12/2012 Activity: pelvic rest Diet: routine Medications: Ibuprofen Condition: improved Instructions: refer to practice specific booklet Discharge to: home   Newborn Data: Live born female  Birth Weight: 8 lb 9.7 oz (3904 g) APGAR: 9, 9  Home with mother.  Kelly Ramsey L 06/12/2012, 8:07 AM

## 2012-06-27 ENCOUNTER — Other Ambulatory Visit: Payer: Self-pay | Admitting: Endocrinology

## 2012-06-27 DIAGNOSIS — E049 Nontoxic goiter, unspecified: Secondary | ICD-10-CM

## 2012-06-28 ENCOUNTER — Ambulatory Visit
Admission: RE | Admit: 2012-06-28 | Discharge: 2012-06-28 | Disposition: A | Discharge status: Home / Self Care | Payer: Managed Care, Other (non HMO) | Source: Ambulatory Visit | Attending: Endocrinology | Admitting: Endocrinology

## 2012-06-28 DIAGNOSIS — E049 Nontoxic goiter, unspecified: Secondary | ICD-10-CM

## 2012-07-15 ENCOUNTER — Other Ambulatory Visit: Payer: Self-pay | Admitting: Endocrinology

## 2012-07-15 DIAGNOSIS — E049 Nontoxic goiter, unspecified: Secondary | ICD-10-CM

## 2013-01-03 ENCOUNTER — Other Ambulatory Visit: Payer: Managed Care, Other (non HMO)

## 2013-01-09 ENCOUNTER — Other Ambulatory Visit: Payer: Managed Care, Other (non HMO)

## 2013-01-17 ENCOUNTER — Ambulatory Visit
Admission: RE | Admit: 2013-01-17 | Discharge: 2013-01-17 | Disposition: A | Discharge status: Home / Self Care | Payer: Managed Care, Other (non HMO) | Source: Ambulatory Visit | Attending: Endocrinology | Admitting: Endocrinology

## 2013-01-17 DIAGNOSIS — E049 Nontoxic goiter, unspecified: Secondary | ICD-10-CM

## 2013-06-26 ENCOUNTER — Other Ambulatory Visit: Payer: Self-pay | Admitting: Endocrinology

## 2013-06-26 DIAGNOSIS — E049 Nontoxic goiter, unspecified: Secondary | ICD-10-CM

## 2013-12-22 ENCOUNTER — Ambulatory Visit
Admission: RE | Admit: 2013-12-22 | Discharge: 2013-12-22 | Disposition: A | Discharge status: Home / Self Care | Payer: Managed Care, Other (non HMO) | Source: Ambulatory Visit | Attending: Endocrinology | Admitting: Endocrinology

## 2013-12-22 DIAGNOSIS — E049 Nontoxic goiter, unspecified: Secondary | ICD-10-CM

## 2014-01-19 ENCOUNTER — Encounter (HOSPITAL_COMMUNITY): Payer: Self-pay | Admitting: Anesthesiology

## 2015-04-30 ENCOUNTER — Encounter (HOSPITAL_COMMUNITY): Payer: Self-pay

## 2015-04-30 ENCOUNTER — Ambulatory Visit (HOSPITAL_COMMUNITY)
Admission: RE | Admit: 2015-04-30 | Discharge: 2015-04-30 | Disposition: A | Discharge status: Home / Self Care | Payer: Managed Care, Other (non HMO) | Source: Ambulatory Visit | Attending: Obstetrics & Gynecology | Admitting: Obstetrics & Gynecology

## 2015-04-30 DIAGNOSIS — Z0371 Encounter for suspected problem with amniotic cavity and membrane ruled out: Secondary | ICD-10-CM | POA: Diagnosis not present

## 2015-04-30 DIAGNOSIS — O26892 Other specified pregnancy related conditions, second trimester: Secondary | ICD-10-CM | POA: Insufficient documentation

## 2015-04-30 DIAGNOSIS — Z3A17 17 weeks gestation of pregnancy: Secondary | ICD-10-CM | POA: Diagnosis not present

## 2015-04-30 DIAGNOSIS — O09522 Supervision of elderly multigravida, second trimester: Secondary | ICD-10-CM | POA: Insufficient documentation

## 2015-04-30 NOTE — Consult Note (Signed)
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  She had a normal anatomy ultrasound and this finding was unexpected.  No other abnormalities were noted.  This pregnancy thus far has been uncomplicated - she underwent cell-free fetal DNA (Panorama) for advanced maternal age that was low risk for aneuploidy.  She is without complaints today.  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 Fibroids   . Thyroid nodule     PSH:  Past Surgical History  Procedure Laterality Date  . Cesarean section    . Gum surgery     Meds:  Current Outpatient Prescriptions on File Prior to Encounter  Medication Sig Dispense Refill  . Prenatal Vit-Fe Fumarate-FA (PRENATAL MULTIVITAMIN) TABS Take 1 tablet by mouth at bedtime.    . hydrocortisone-pramoxine (PROCTOFOAM HC) rectal foam Place 1 applicator rectally 2 (two) times daily. (Patient not taking: Reported on 04/30/2015) 10 g 0  . ibuprofen (ADVIL,MOTRIN) 600 MG tablet Take 1 tablet (600 mg total) by mouth every 6 (six) hours. (Patient not taking: Reported on 04/30/2015) 30 tablet 0   No current facility-administered medications on file prior to encounter.   Allergies:  Allergies  Allergen  Reactions  . Penicillins Rash   FH: History reviewed. No pertinent family history.   Soc:  Social History   Social History  . Marital Status: Married    Spouse Name: N/A  . Number of Children: N/A  . Years of Education: N/A   Occupational History  . Not on file.   Social History Main Topics  . Smoking status: Never Smoker   . Smokeless tobacco: Not on file  . Alcohol Use: No  . Drug Use: No  . Sexual Activity: Not Currently   Other Topics Concern  . Not on file   Social History Narrative    Review of Systems: no vaginal bleeding or cramping/contractions, no LOF, no nausea/vomiting. All other systems reviewed and are negative.  PE:   Filed Vitals:   04/30/15 1340  BP: 102/65  Pulse: 80    A/P: 1) Single IUP at 17w 1d  2) History of amniotic band syndrome - we briefly discussed what is thought to be the etiology of amniotic band syndrome.  The recurrence rate for amniotic band syndrome is low (likely < 1%) and is thought to occur with injury to the amnion and not chorion that can stick and wrap around appendages and possibly result in amputations or other abnormalities (cleft lip/ palate, abdominal wall defects).  There do not appear to be any obstetrical risks factors that are associated with amniotic band syndrome, but cleft foot is frequently found in  this disorder.  In the event that an amniotic band is encountered with potential for amputations, fetal surgical intervention can be offered as select centers.  Recommend detailed ultrasound - with particular attention to the extremities and appendages.  Kelly Ramsey is in the process of transferring care to a [facility] facility.  Due to insurance issues, we have tentatively scheduled a detailed ultrasound a [facility] in 1-2 weeks for further evaluation.   Thank you for the opportunity to be a part of the care of Kelly Ramsey. Please contact our office if we can be of further assistance.    I spent approximately 30 minutes with this patient with over 50% of time spent in face-to-face counseling.  Benjaman Lobe, MD Maternal Fetal Medicine

## 2015-05-10 ENCOUNTER — Other Ambulatory Visit (HOSPITAL_COMMUNITY): Payer: Self-pay

## 2015-05-10 ENCOUNTER — Encounter (HOSPITAL_COMMUNITY): Payer: Self-pay

## 2016-02-08 IMAGING — US US SOFT TISSUE HEAD/NECK
1 series · 13 of 25 positions shown · non-contrast
Comparison: Prior thyroid ultrasound 01/17/2013

CLINICAL DATA: 33-year-old female with known bilateral thyroid
nodules. The dominant right thyroid nodule was previously biopsied

EXAM:
THYROID ULTRASOUND
TECHNIQUE: Ultrasound examination of the thyroid gland and adjacent soft
tissues was performed.

[Series 1: us soft tissue head/neck · 0.08mm/px · 13 of 54 slices shown]
[im 1/54]
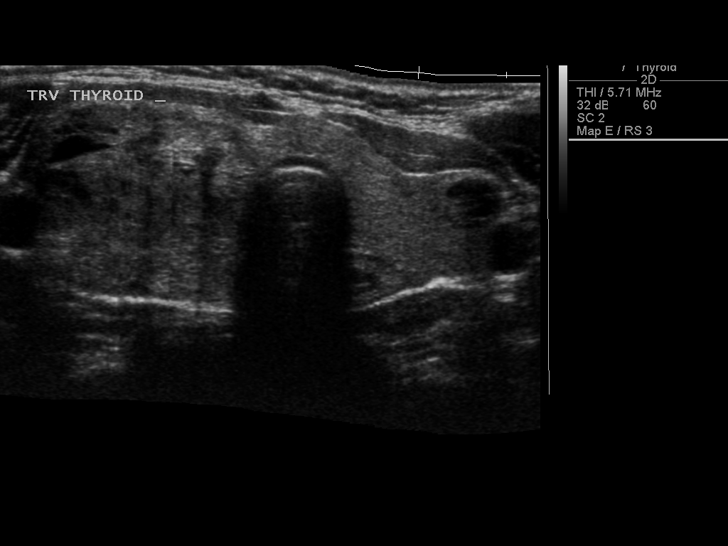
[im 5/54]
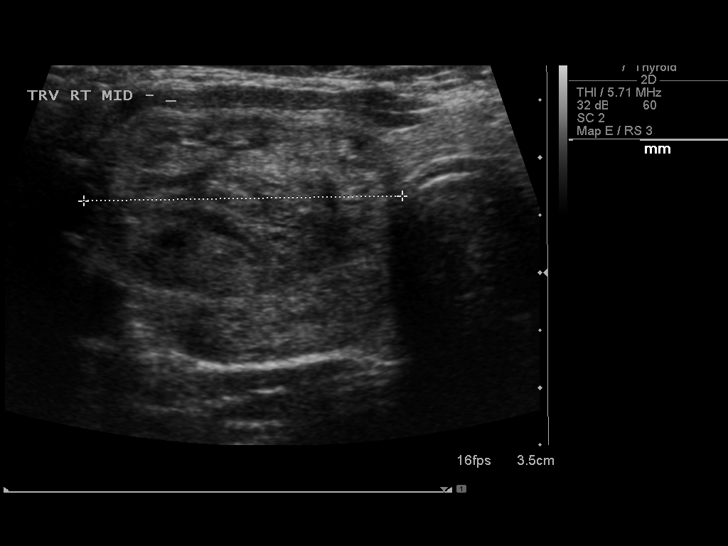
[im 9/54]
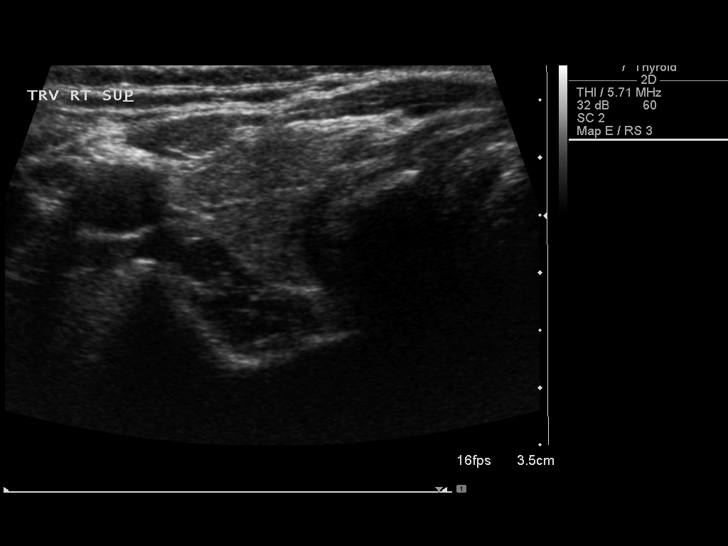
[im 14/54]
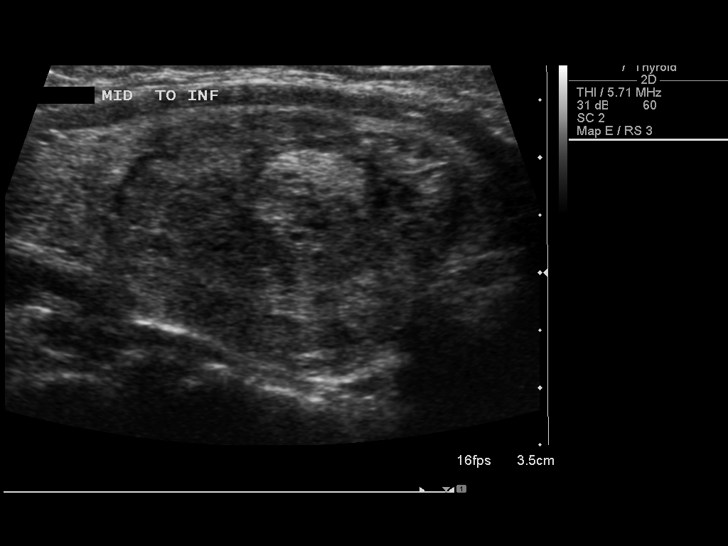
[im 18/54]
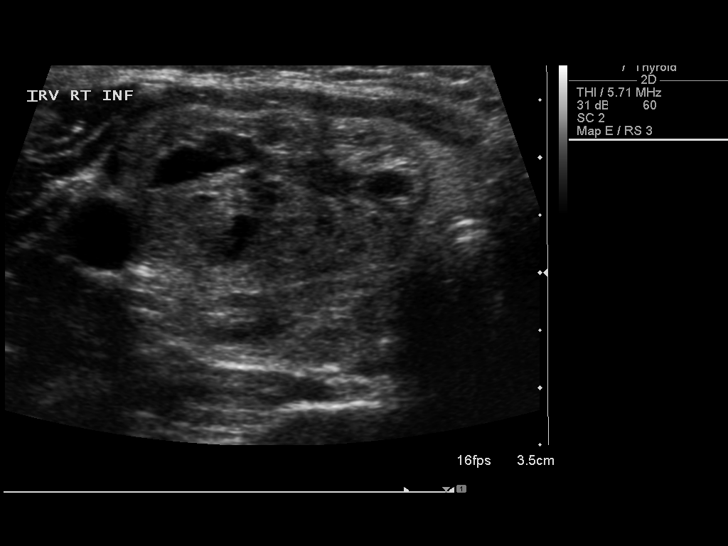
[im 23/54]
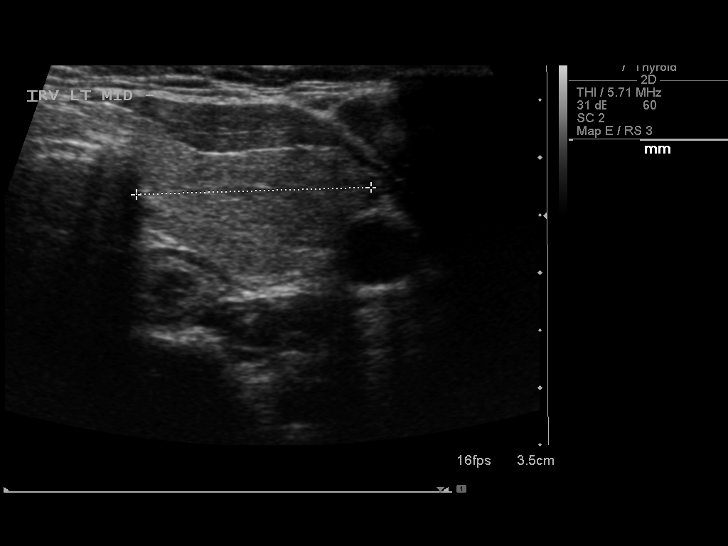
[im 27/54]
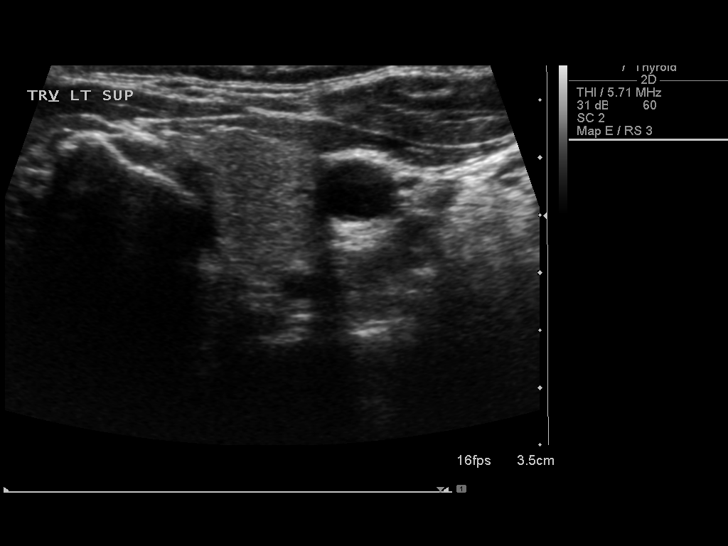
[im 31/54]
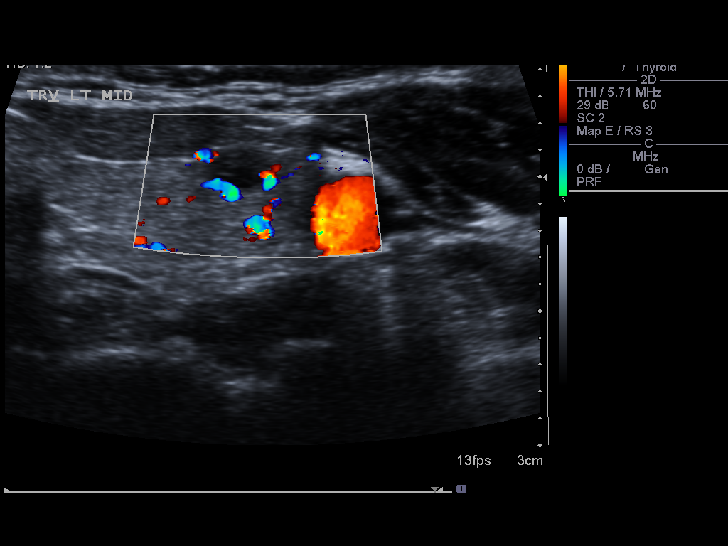
[im 36/54]
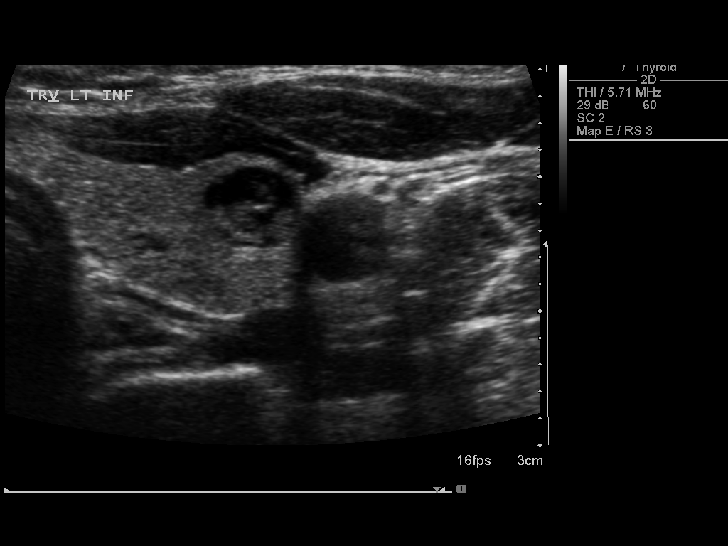
[im 40/54]
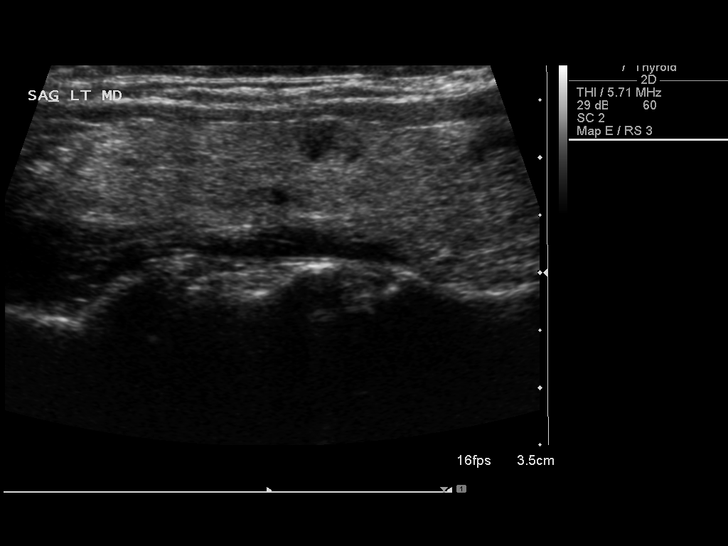
[im 45/54]
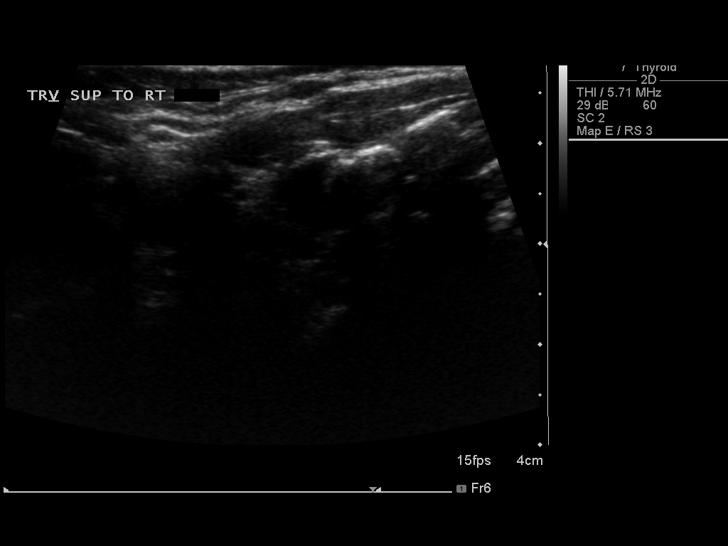
[im 49/54]
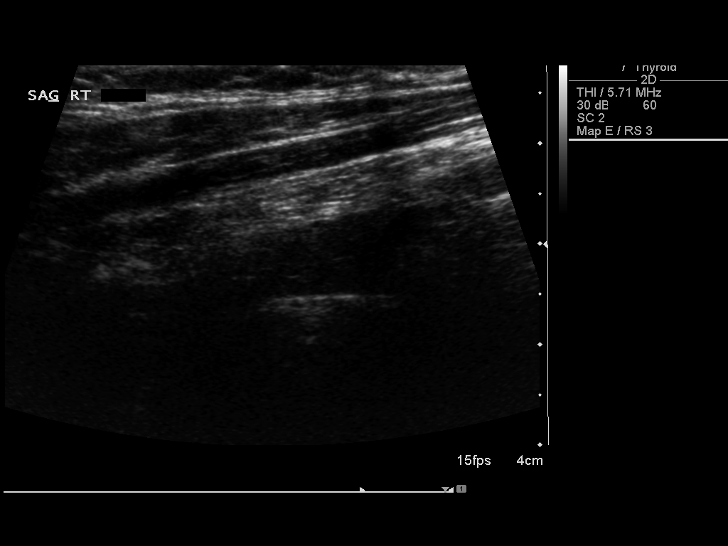
[im 54/54]
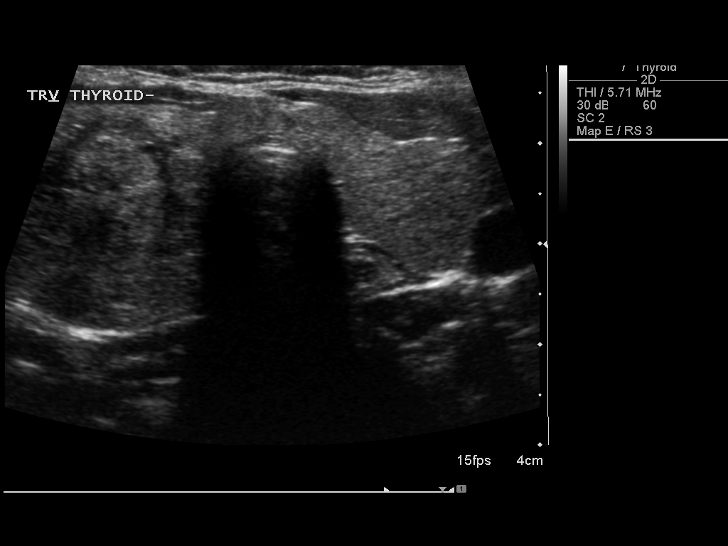

[13 of 25 positions shown; findings below may reference images not displayed]

FINDINGS: Right thyroid lobe

Measurements: 6.2 x 2.4 x 2.8 cm. Dominant solid heterogeneous
nodule the he of the majority of the lower lobe measures 3.4 x 2.3 x
2.7 cm which is slightly smaller compared to 3.4 x 2.5 x 3.6 cm as
previously measured. Size decreased likely due to involution of the
cystic components of the nodule.

Left thyroid lobe

Measurements: 5.2 x 1.3 x 2.0 cm. Previously identified small 4 mm
hypoechoic nodule in the superficial aspect of the mid gland has a
slightly enlarged to 7 mm. The enlargement is largely due to an
increased cystic component. More inferiorly, a 7 mm mixed cystic and
solid nodule is in significantly changed.

Isthmus

Thickness: 0.6 cm.  No nodules visualized.

Lymphadenopathy

None visualized.
IMPRESSION: 1. Slight interval decrease in size of the previously biopsied
dominant solid heterogeneous nodule in the lower right gland. The
interval size decrease is likely due to involution of the previously
identified cystic components.
2. Slight interval enlargement of a small mixed cystic and solid
nodule in the mid aspect of the left gland. Previously, the nodule
measured 4 mm. Today it measures 7 mm. The interval enlargement is
predominantly due to a slightly greater cystic component.
Technically speaking, an enlarging nodule meets consensus criteria
for biopsy. However, this nodule remains very small and the interval
size enlargement is largely due to an increase in the cystic
component. Consider followup thyroid ultrasound in 6 - 12 months.

## 2016-03-04 ENCOUNTER — Encounter (HOSPITAL_COMMUNITY): Payer: Self-pay

## 2016-06-02 ENCOUNTER — Other Ambulatory Visit: Payer: Self-pay | Admitting: Endocrinology

## 2016-06-02 DIAGNOSIS — E049 Nontoxic goiter, unspecified: Secondary | ICD-10-CM

## 2016-06-13 ENCOUNTER — Other Ambulatory Visit: Payer: Managed Care, Other (non HMO)

## 2017-05-31 DIAGNOSIS — E049 Nontoxic goiter, unspecified: Secondary | ICD-10-CM | POA: Diagnosis not present

## 2017-06-01 ENCOUNTER — Other Ambulatory Visit: Payer: Self-pay | Admitting: Endocrinology

## 2017-06-01 DIAGNOSIS — E049 Nontoxic goiter, unspecified: Secondary | ICD-10-CM

## 2017-06-12 ENCOUNTER — Ambulatory Visit
Admission: RE | Admit: 2017-06-12 | Discharge: 2017-06-12 | Disposition: A | Discharge status: Home / Self Care | Payer: BLUE CROSS/BLUE SHIELD | Source: Ambulatory Visit | Attending: Endocrinology | Admitting: Endocrinology

## 2017-06-12 DIAGNOSIS — E041 Nontoxic single thyroid nodule: Secondary | ICD-10-CM | POA: Diagnosis not present

## 2017-06-12 DIAGNOSIS — E049 Nontoxic goiter, unspecified: Secondary | ICD-10-CM

## 2017-07-20 DIAGNOSIS — R300 Dysuria: Secondary | ICD-10-CM | POA: Diagnosis not present

## 2017-07-20 DIAGNOSIS — M549 Dorsalgia, unspecified: Secondary | ICD-10-CM | POA: Diagnosis not present

## 2018-03-01 DIAGNOSIS — J069 Acute upper respiratory infection, unspecified: Secondary | ICD-10-CM | POA: Diagnosis not present

## 2018-03-22 DIAGNOSIS — Y999 Unspecified external cause status: Secondary | ICD-10-CM | POA: Diagnosis not present

## 2018-03-22 DIAGNOSIS — S59901A Unspecified injury of right elbow, initial encounter: Secondary | ICD-10-CM | POA: Diagnosis not present

## 2018-03-22 DIAGNOSIS — S5001XA Contusion of right elbow, initial encounter: Secondary | ICD-10-CM | POA: Diagnosis not present

## 2018-03-22 DIAGNOSIS — M25521 Pain in right elbow: Secondary | ICD-10-CM | POA: Diagnosis not present

## 2018-03-22 DIAGNOSIS — W010XXA Fall on same level from slipping, tripping and stumbling without subsequent striking against object, initial encounter: Secondary | ICD-10-CM | POA: Diagnosis not present

## 2018-11-29 ENCOUNTER — Other Ambulatory Visit: Payer: Self-pay | Admitting: Endocrinology

## 2018-11-29 DIAGNOSIS — E049 Nontoxic goiter, unspecified: Secondary | ICD-10-CM

## 2018-12-10 ENCOUNTER — Ambulatory Visit
Admission: RE | Admit: 2018-12-10 | Discharge: 2018-12-10 | Disposition: A | Discharge status: Home / Self Care | Payer: BLUE CROSS/BLUE SHIELD | Source: Ambulatory Visit | Attending: Endocrinology | Admitting: Endocrinology

## 2018-12-10 DIAGNOSIS — E049 Nontoxic goiter, unspecified: Secondary | ICD-10-CM

## 2019-11-24 IMAGING — US US THYROID
1 series · 13 of 25 positions shown · non-contrast
Comparison: 12/22/2013 and 06/28/2012

CLINICAL DATA: Multinodular goiter. Dominant right thyroid nodule
was biopsied on July 27, 2011.

EXAM:
THYROID ULTRASOUND
TECHNIQUE: Ultrasound examination of the thyroid gland and adjacent soft
tissues was performed.

[Series 1: us thyroid · 0.06mm/px · 13 of 51 slices shown]
[im 1/51]
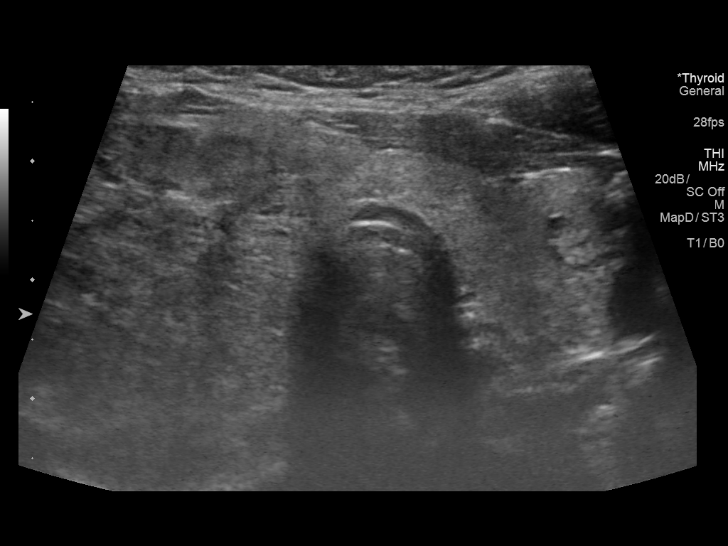
[im 5/51]
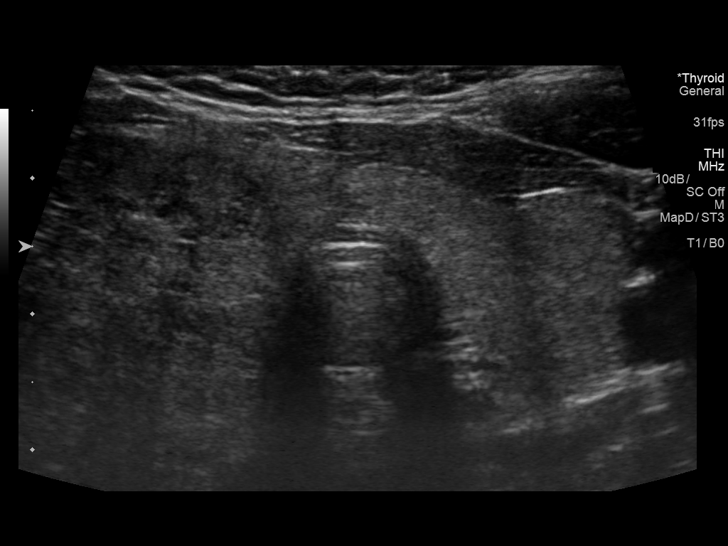
[im 9/51]
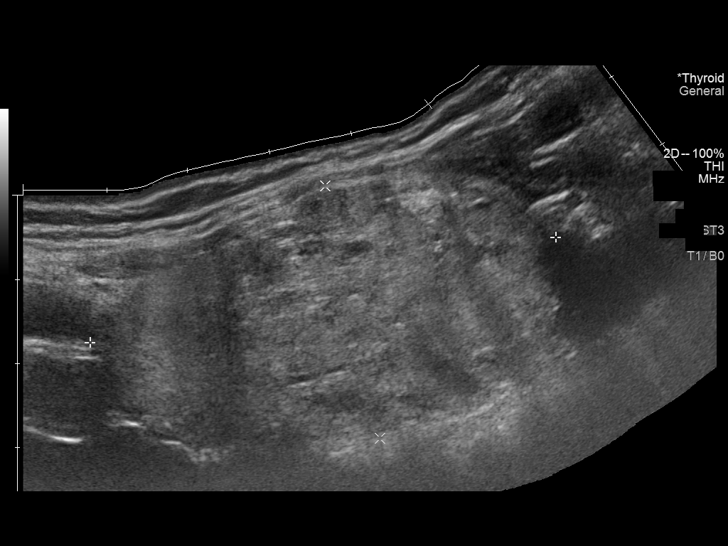
[im 13/51]
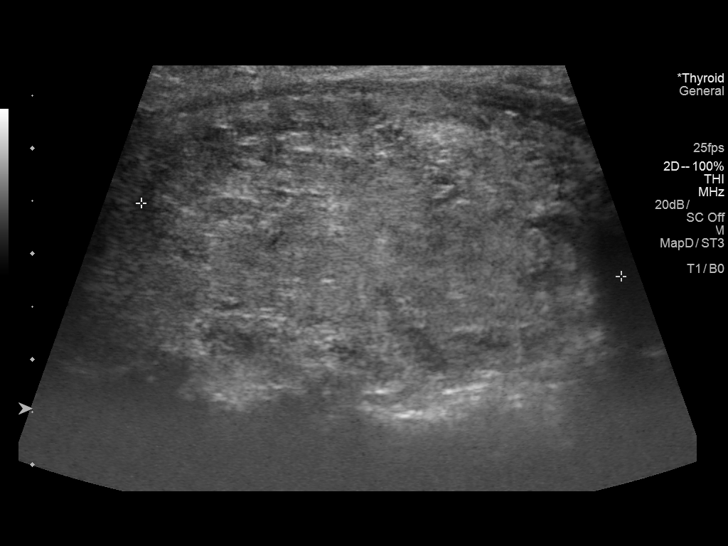
[im 17/51]
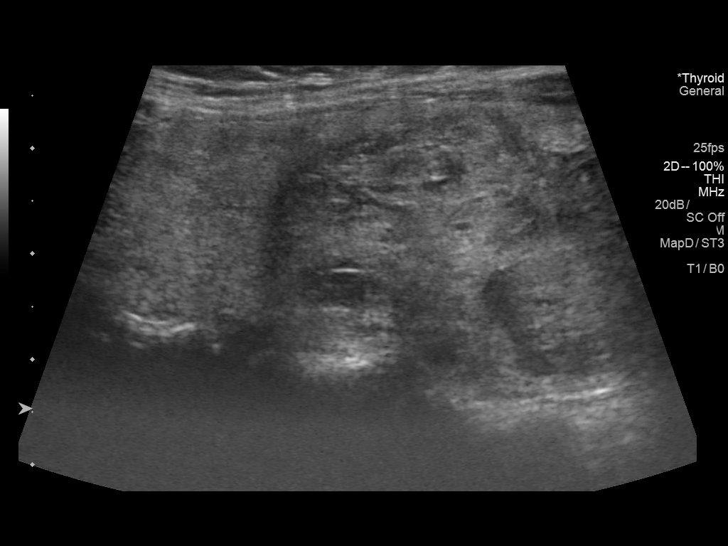
[im 21/51]
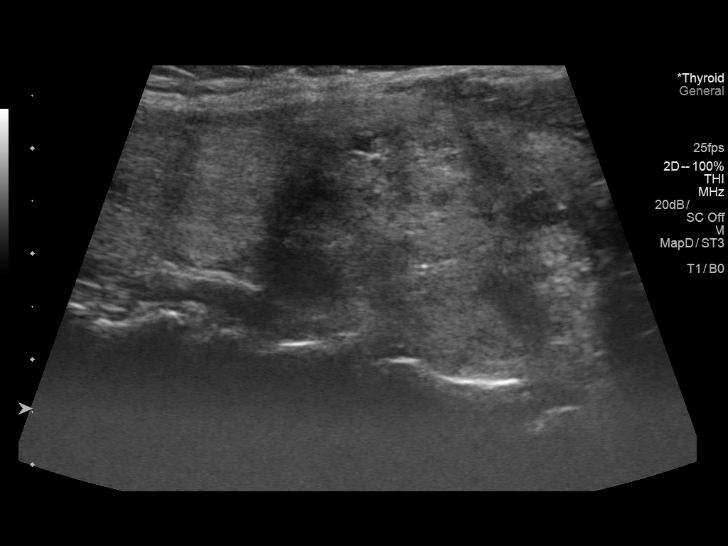
[im 26/51]
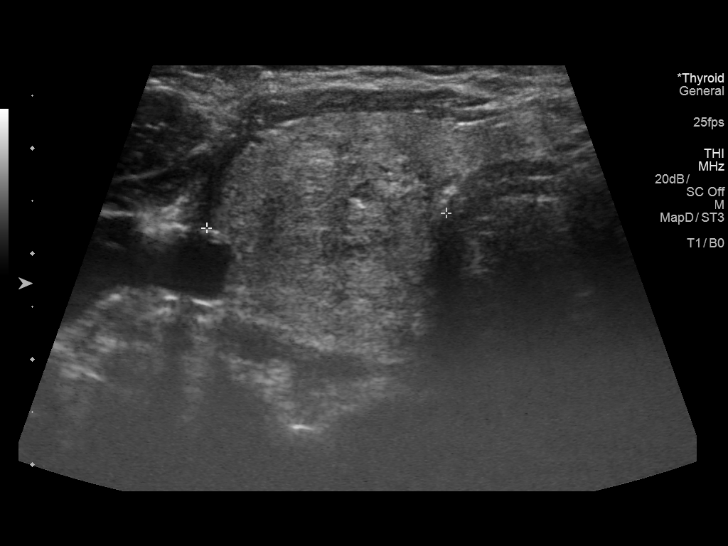
[im 30/51]
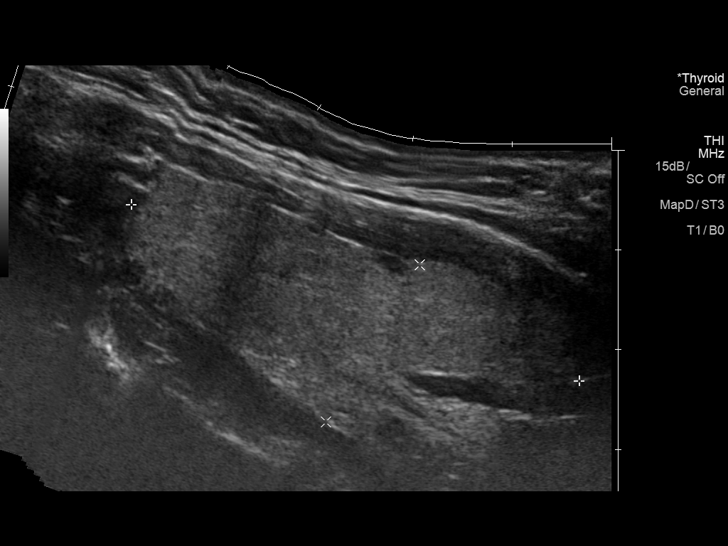
[im 34/51]
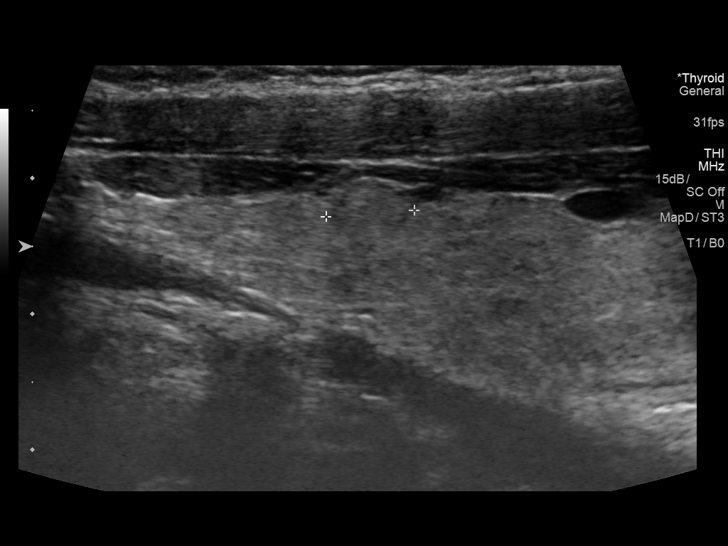
[im 38/51]
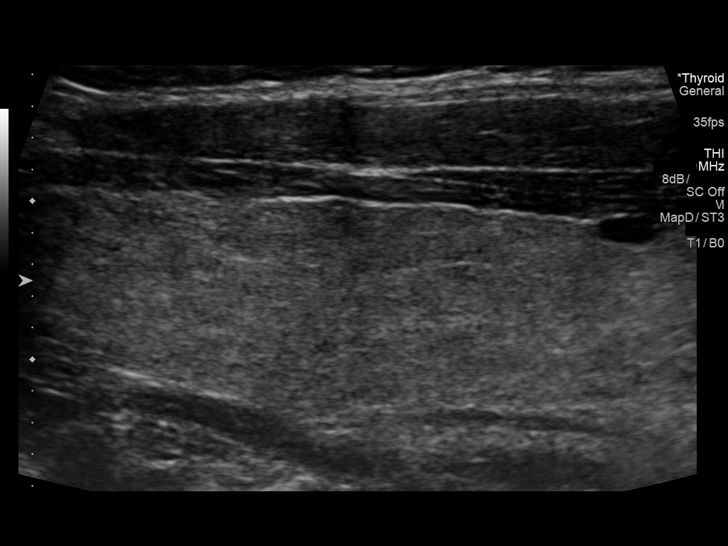
[im 42/51]
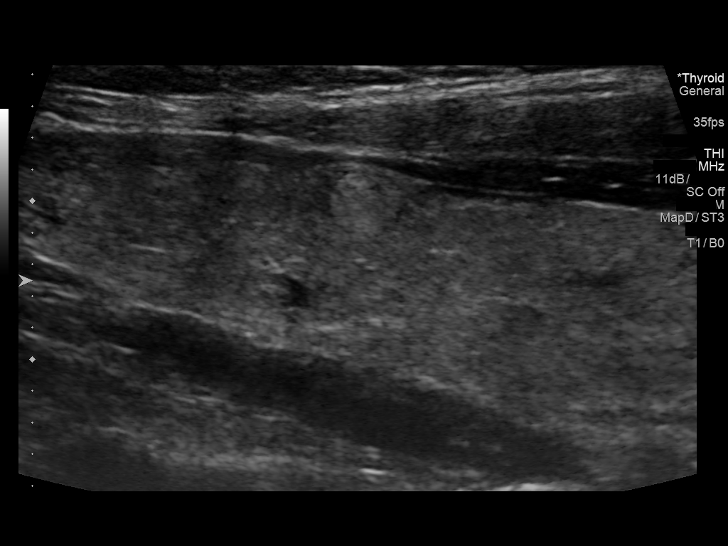
[im 46/51]
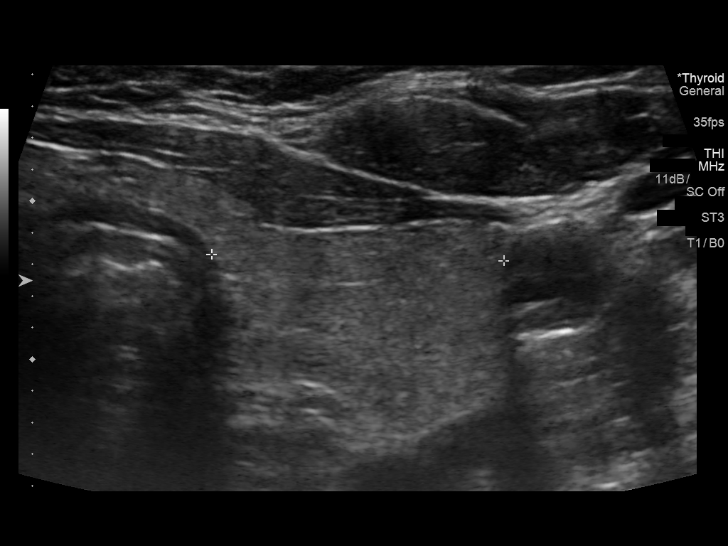
[im 51/51]
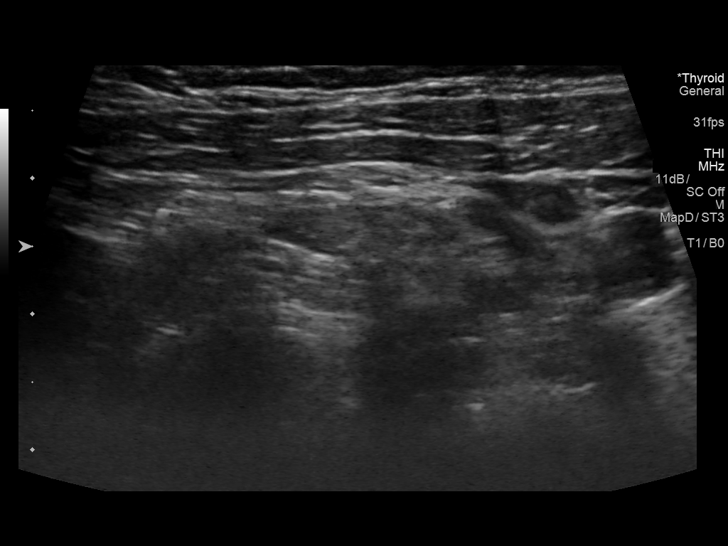

[13 of 25 positions shown; findings below may reference images not displayed]

FINDINGS: Parenchymal Echotexture: Mildly heterogenous

Isthmus: 0.6 cm, previously 0.6 cm

Right lobe: 5.7 x 3.1 x 2.3 cm, previously 6.2 x 2.4 x 2.8 cm

Left lobe: 4.8 x 1.8 x 1.9 cm, previously 5.2 x 1.3 x 2.0 cm

_________________________________________________________

Estimated total number of nodules >/= 1 cm: 1

Number of spongiform nodules >/=  2 cm not described below (TR1): 0

Number of mixed cystic and solid nodules >/= 1.5 cm not described
below (TR2): 0

_________________________________________________________

Again noted is a large heterogeneous nodule throughout the mid and
inferior right thyroid lobe. This nodule is isoechoic. The dominant
right thyroid nodule measures 4.6 x 3.1 x 3.3 cm and previously
measured 3.4 x 2.3 x 2.7 cm. Few macro calcifications associated
with this dominant thyroid nodule.

There is an isoechoic nodule in the mid left thyroid lobe that
measures 0.7 x 0.5 x 0.5 cm. This nodule previously had a cystic
component and measured up to 0.7 cm.

Isoechoic nodule in the inferior left thyroid lobe measures 0.9 x
0.7 x 0.7 cm and previously measured 1.0 x 0.7 x 0.8 cm.
IMPRESSION: Dominant right thyroid nodule has enlarged since 9018. This nodule
was biopsied in 4717.

Small left thyroid nodules are relatively stable in size although
these nodules are less cystic compared to 9018. Left thyroid nodules
do not meet criteria for biopsy.

The above is in keeping with the ACR TI-RADS recommendations - [HOSPITAL] 8694;[DATE].

## 2020-01-06 ENCOUNTER — Other Ambulatory Visit: Payer: Self-pay | Admitting: Endocrinology

## 2020-01-06 DIAGNOSIS — E049 Nontoxic goiter, unspecified: Secondary | ICD-10-CM

## 2020-03-20 HISTORY — PX: GUM SURGERY: SHX658

## 2020-06-22 DIAGNOSIS — R2689 Other abnormalities of gait and mobility: Secondary | ICD-10-CM | POA: Diagnosis not present

## 2020-06-22 DIAGNOSIS — M25552 Pain in left hip: Secondary | ICD-10-CM | POA: Diagnosis not present

## 2020-06-22 DIAGNOSIS — M6281 Muscle weakness (generalized): Secondary | ICD-10-CM | POA: Diagnosis not present

## 2020-07-02 DIAGNOSIS — R2689 Other abnormalities of gait and mobility: Secondary | ICD-10-CM | POA: Diagnosis not present

## 2020-07-02 DIAGNOSIS — M25552 Pain in left hip: Secondary | ICD-10-CM | POA: Diagnosis not present

## 2020-07-02 DIAGNOSIS — M6281 Muscle weakness (generalized): Secondary | ICD-10-CM | POA: Diagnosis not present

## 2021-01-05 DIAGNOSIS — E049 Nontoxic goiter, unspecified: Secondary | ICD-10-CM | POA: Diagnosis not present

## 2021-01-07 ENCOUNTER — Other Ambulatory Visit: Payer: Self-pay | Admitting: Endocrinology

## 2021-01-07 DIAGNOSIS — E049 Nontoxic goiter, unspecified: Secondary | ICD-10-CM

## 2021-01-24 ENCOUNTER — Other Ambulatory Visit: Payer: BC Managed Care – PPO

## 2021-02-08 ENCOUNTER — Ambulatory Visit
Admission: RE | Admit: 2021-02-08 | Discharge: 2021-02-08 | Disposition: A | Discharge status: Home / Self Care | Payer: BC Managed Care – PPO | Source: Ambulatory Visit | Attending: Endocrinology | Admitting: Endocrinology

## 2021-02-08 DIAGNOSIS — E042 Nontoxic multinodular goiter: Secondary | ICD-10-CM | POA: Diagnosis not present

## 2021-02-08 DIAGNOSIS — E049 Nontoxic goiter, unspecified: Secondary | ICD-10-CM

## 2021-03-20 HISTORY — PX: BIOPSY ENDOMETRIAL: PRO11

## 2021-07-06 DIAGNOSIS — R509 Fever, unspecified: Secondary | ICD-10-CM | POA: Diagnosis not present

## 2021-07-06 DIAGNOSIS — R051 Acute cough: Secondary | ICD-10-CM | POA: Diagnosis not present

## 2021-07-06 DIAGNOSIS — U071 COVID-19: Secondary | ICD-10-CM | POA: Diagnosis not present

## 2021-07-07 DIAGNOSIS — R051 Acute cough: Secondary | ICD-10-CM | POA: Diagnosis not present

## 2021-07-07 DIAGNOSIS — U071 COVID-19: Secondary | ICD-10-CM | POA: Diagnosis not present

## 2021-08-01 ENCOUNTER — Ambulatory Visit: Payer: BC Managed Care – PPO | Admitting: Family Medicine

## 2021-08-01 ENCOUNTER — Encounter: Payer: Self-pay | Admitting: Family Medicine

## 2021-08-01 ENCOUNTER — Encounter: Payer: Self-pay | Admitting: *Deleted

## 2021-08-01 VITALS — BP 112/80 | HR 60 | Temp 98.1°F | Ht 64.0 in | Wt 185.5 lb

## 2021-08-01 DIAGNOSIS — E538 Deficiency of other specified B group vitamins: Secondary | ICD-10-CM

## 2021-08-01 DIAGNOSIS — Z1159 Encounter for screening for other viral diseases: Secondary | ICD-10-CM

## 2021-08-01 DIAGNOSIS — E559 Vitamin D deficiency, unspecified: Secondary | ICD-10-CM

## 2021-08-01 DIAGNOSIS — E041 Nontoxic single thyroid nodule: Secondary | ICD-10-CM | POA: Diagnosis not present

## 2021-08-01 DIAGNOSIS — Z0000 Encounter for general adult medical examination without abnormal findings: Secondary | ICD-10-CM

## 2021-08-01 LAB — COMPREHENSIVE METABOLIC PANEL
ALT: 13 U/L (ref 0–35)
AST: 15 U/L (ref 0–37)
Albumin: 4.3 g/dL (ref 3.5–5.2)
Alkaline Phosphatase: 81 U/L (ref 39–117)
BUN: 17 mg/dL (ref 6–23)
CO2: 24 mEq/L (ref 19–32)
Calcium: 9.6 mg/dL (ref 8.4–10.5)
Chloride: 105 mEq/L (ref 96–112)
Creatinine, Ser: 0.78 mg/dL (ref 0.40–1.20)
GFR: 94.39 mL/min (ref >60.00)
Glucose, Bld: 83 mg/dL (ref 70–99)
Potassium: 4.7 mEq/L (ref 3.5–5.1)
Sodium: 139 mEq/L (ref 135–145)
Total Bilirubin: 0.5 mg/dL (ref 0.2–1.2)
Total Protein: 7.3 g/dL (ref 6.0–8.3)

## 2021-08-01 LAB — CBC WITH DIFFERENTIAL/PLATELET
Basophils Absolute: 0 10*3/uL (ref 0.0–0.1)
Basophils Relative: 0.6 % (ref 0.0–3.0)
Eosinophils Absolute: 0.2 10*3/uL (ref 0.0–0.7)
Eosinophils Relative: 2.7 % (ref 0.0–5.0)
HCT: 37.2 % (ref 36.0–46.0)
Hemoglobin: 12.2 g/dL (ref 12.0–15.0)
Lymphocytes Relative: 26.6 % (ref 12.0–46.0)
Lymphs Abs: 1.5 10*3/uL (ref 0.7–4.0)
MCHC: 32.6 g/dL (ref 30.0–36.0)
MCV: 83.4 fl (ref 78.0–100.0)
Monocytes Absolute: 0.4 10*3/uL (ref 0.1–1.0)
Monocytes Relative: 6.8 % (ref 3.0–12.0)
Neutro Abs: 3.6 10*3/uL (ref 1.4–7.7)
Neutrophils Relative %: 63.3 % (ref 43.0–77.0)
Platelets: 217 10*3/uL (ref 150.0–400.0)
RBC: 4.47 Mil/uL (ref 3.87–5.11)
RDW: 15.7 % — ABNORMAL HIGH (ref 11.5–15.5)
WBC: 5.7 10*3/uL (ref 4.0–10.5)

## 2021-08-01 LAB — VITAMIN B12: Vitamin B-12: 285 pg/mL (ref 211–911)

## 2021-08-01 LAB — LIPID PANEL
Cholesterol: 163 mg/dL (ref 0–200)
HDL: 57.4 mg/dL (ref >39.00)
LDL Cholesterol: 84 mg/dL (ref 0–99)
NonHDL: 105.62
Total CHOL/HDL Ratio: 3
Triglycerides: 109 mg/dL (ref 0.0–149.0)
VLDL: 21.8 mg/dL (ref 0.0–40.0)

## 2021-08-01 LAB — T3, FREE: T3, Free: 2.3 pg/mL (ref 2.3–4.2)

## 2021-08-01 LAB — VITAMIN D 25 HYDROXY (VIT D DEFICIENCY, FRACTURES): VITD: 19.84 ng/mL — ABNORMAL LOW (ref 30.00–100.00)

## 2021-08-01 LAB — T4, FREE: Free T4: 0.72 ng/dL (ref 0.60–1.60)

## 2021-08-01 LAB — HEMOGLOBIN A1C: Hgb A1c MFr Bld: 5.5 % (ref 4.6–6.5)

## 2021-08-01 LAB — TSH: TSH: 1.16 u[IU]/mL (ref 0.35–5.50)

## 2021-08-01 MED ORDER — VITAMIN D (ERGOCALCIFEROL) 1.25 MG (50000 UNIT) PO CAPS: 50000.0000 [IU] | ORAL_CAPSULE | ORAL | 3 refills | Status: DC

## 2021-08-01 MED ORDER — CYANOCOBALAMIN 1000 MCG/ML IJ SOLN: 1000.0000 ug | INTRAMUSCULAR | 3 refills | Status: DC

## 2021-08-01 MED ORDER — "SYRINGE 25G X 1"" 3 ML MISC": 1.0000 | 1 refills | Status: DC

## 2021-08-01 NOTE — Progress Notes (Signed)
?Phone [phone] ?  ?Subjective:  ? ?Patient is a 41 y.o. female presenting for annual physical.  -changing docs ?Last cpx 12/24/18-pap and hpv neg ?B12 deficiency-not vegetarian but not a lot of meat-chicken twice weekly  not regular w/injections-last 1 mo ago.  ?D deficiency-on 50K monthly-last1 mo ago.  ?Thyroid nodule-seeing endo Dr. Roxanne Gates had bx in past.  No major problems so not want surgery yet.  ?Wt-trying to lose-exercising 4x/wk.  Menses irreg for past 1 yr. LMP Feb.  Some spotting while exercising.  LMP 5/1 and lasted 10 days.  Some spotting every few days. TSH checked 6 mo ago. No GDM, children about 8#.   Sch w/gyn Dr. Michaell Cowing. 5/25.   ? ?Chief Complaint  ?Patient presents with  ? Establish Care  ?  Need new PCP ?Fasting, would, like vitamin d and b12 checked ? ?  ? ? ?See problem oriented charting- ?ROS- ROS: ?Gen: no fever, chills  ?Skin: no rash, itching ?ENT: no ear pain, ear drainage, nasal congestion, rhinorrhea, sinus pressure, sore throat ?Eyes: no blurry vision, double vision.  More reading.  ?Resp: no cough, wheeze,SOB.  Got covid-occ cough.  ?CV: no CP, palpitations, LE edema,  ?GI: no heartburn, n/v/d/c, abd pain ?GU: no dysuria, urgency, frequency, hematuria ?MSK: no joint pain, myalgias, back pain.  Plantar fasciitis 3-4 yrs.  ?Neuro: no dizziness, headache, weakness, vertigo ?Psych: no depression, anxiety, insomnia, SI  ? ?The following were reviewed and entered/updated in epic: ?Past Medical History:  ?Diagnosis Date  ? B12 deficiency   ? Fibroids   ? Thyroid nodule   ? Vitamin D deficiency   ? ?Patient Active Problem List  ? Diagnosis Date Noted  ? Vitamin B12 deficiency 08/01/2021  ? Vitamin D deficiency 08/01/2021  ? Thyroid nodule 08/01/2021  ? KNEE PAIN, BILATERAL 03/01/2010  ? 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 WEIGHT GAIN 03/01/2010  ? ?Past Surgical History:  ?Procedure Laterality Date  ? CESAREAN SECTION  2009  ? GUM SURGERY  2022  ? TUBAL LIGATION    ? ? ?Family History   ?Problem Relation Age of Onset  ? Hypertension Mother   ? Diabetes Mother   ? Arthritis Mother   ? Hypertension Father   ? Diabetes Father   ? Cancer Paternal Grandfather   ? ? ?Medications- reviewed and updated ?Current Outpatient Medications  ?Medication Sig Dispense Refill  ? Syringe/Needle, Disp, (SYRINGE 3CC/25GX1") 25G X 1" 3 ML MISC 1 each by Does not apply route every 30 (thirty) days. 10 each 1  ? VITAMIN D PO Take 50,000 Units by mouth every 30 (thirty) days.    ? Vitamin D, Ergocalciferol, (DRISDOL) 1.25 MG (50000 UNIT) CAPS capsule Take 1 capsule (50,000 Units total) by mouth every 30 (thirty) days. 3 capsule 3  ? cyanocobalamin (,VITAMIN B-12,) 1000 MCG/ML injection Inject 1 mL (1,000 mcg total) into the muscle every 30 (thirty) days. 3 mL 3  ? ?No current facility-administered medications for this visit.  ? ? ?Allergies-reviewed and updated ?Allergies  ?Allergen Reactions  ? Acetaminophen Itching  ?  In her throat  ? Penicillins Rash and Other (See Comments)  ? ? ?Social History  ? 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 ?Objective  ?Objective:  ?BP 112/80   Pulse 60   Temp 98.1 ?F (36.7 ?C) (Temporal)   Ht '5\' 4"'$  (1.626 m)   Wt 185 lb 8 oz (84.1 kg)   LMP 07/19/2021 (Exact Date)   SpO2 99%   Breastfeeding  Unknown   BMI 31.84 kg/m?  ?Physical Exam  ?Gen: WDWN NAD ?HEENT: NCAT, conjunctiva not injected, sclera nonicteric ?TM WNL B, OP moist, no exudates  ?NECK:  supple, + thyromegaly-mostly R but center as well, no nodes, no carotid bruits ?CARDIAC: RRR, S1S2+, no murmur. DP 2+B ?LUNGS: CTAB. No wheezes ?ABDOMEN:  BS+, soft, NTND, No HSM, no masses ?EXT:  no edema ?MSK: no gross abnormalities. MS 5/5 all 4 ?NEURO: A&O x3.  CN II-XII intact.  ?PSYCH: normal mood. Good eye contact  ?Several lipomas forearms. ? ?  ?Assessment and Plan  ? ?Health Maintenance counseling: ?1. Anticipatory guidance: Patient counseled regarding regular dental exams q6 months, eye exams,  avoiding smoking and  second hand smoke, limiting alcohol to 1 beverage per day, no illicit drugs.   ?2. Risk factor reduction:  Advised patient of need for regular exercise and diet rich and fruits and vegetables to reduce risk of heart attack and stroke. Exercise- increase.  ?Wt Readings from Last 3 Encounters:  ?08/01/21 185 lb 8 oz (84.1 kg)  ?04/30/15 178 lb 3.2 oz (80.8 kg)  ?06/09/12 200 lb (90.7 kg)  ? ?3. Immunizations/screenings/ancillary studies ?Immunization History  ?Administered Date(s) Administered  ? Influenza,inj,quad, With Preservative 12/24/2018  ? Influenza-Unspecified 05/05/2015  ? PFIZER(Purple Top)SARS-COV-2 Vaccination 06/05/2019, 06/26/2019  ? Tdap 11/18/2009, 03/05/2012, 07/28/2015  ? ?Health Maintenance Due  ?Topic Date Due  ? Hepatitis C Screening  Never done  ? PAP SMEAR-Modifier  03/11/2013  ?  ?4. Cervical cancer screening: seeing gyn 5/25 ?5. Skin cancer screening- advised regular sunscreen use. Denies worrisome, changing, or new skin lesions.  ?6. Birth control/STD check: TL ?7. Smoking associated screening: non smoker ?8. Alcohol screening: rare ? ?Problem List Items Addressed This Visit   ? ?  ? Endocrine  ? Thyroid nodule  ? Relevant Orders  ? T3, free  ? T4, free  ?  ? Other  ? Vitamin B12 deficiency  ? Relevant Orders  ? Vitamin B12  ? Vitamin D deficiency  ? Relevant Orders  ? VITAMIN D 25 Hydroxy (Vit-D Deficiency, Fractures)  ? ?Other Visit Diagnoses   ? ? Wellness examination    -  Primary  ? Relevant Orders  ? Hemoglobin A1c  ? Lipid panel  ? TSH  ? Comprehensive metabolic panel  ? CBC with Differential/Platelet  ? Vitamin B12  ? VITAMIN D 25 Hydroxy (Vit-D Deficiency, Fractures)  ? T3, free  ? T4, free  ? Encounter for hepatitis C screening test for low risk patient      ? Relevant Orders  ? Hepatitis C antibody  ? ?  ? Wellness-antic guidance.  Check labs.  Work on [employer].  F/u for wt mgmt if no changes ?Thyroid nodule-managed by endo.  Pt holding on surgery.  Check TFT as menses irreg ?B12  deficiency-on monthly injections but not consistant.  Discussed risks.  Check B12 level-renewed meds ?Vit D deficiency-taking 50K units monthly-check levels ?Irreg menses ans spotting-seeing gyn in 2 weeks.  Defer decision on pap, etc to them.  Check labs.  Check A1C as well.  ? ?Recommended follow up: [place] in about 1 year (around 08/02/2022) for annual. ?Future Appointments  ?Date Time Provider DKimball ?08/03/2022  9:00 AM KTawnya Crook MD [facility]  ? ? ?Lab/Order associations:+ fasting ?  ICD-10-CM   ?1. Wellness examination  Z00.00 Hemoglobin A1c  ?  Lipid panel  ?  TSH  ?  Comprehensive metabolic panel  ?  CBC with Differential/Platelet  ?  Vitamin B12  ?  VITAMIN D 25 Hydroxy (Vit-D Deficiency, Fractures)  ?  T3, free  ?  T4, free  ?  ?2. Vitamin B12 deficiency  E53.8 Vitamin B12  ?  ?3. Vitamin D deficiency  E55.9 VITAMIN D 25 Hydroxy (Vit-D Deficiency, Fractures)  ?  ?4. Thyroid nodule  E04.1 T3, free  ?  T4, free  ?  ?5. Encounter for hepatitis C screening test for low risk patient  Z11.59 Hepatitis C antibody  ?  ? ? ?Meds ordered this encounter  ?Medications  ? cyanocobalamin (,VITAMIN B-12,) 1000 MCG/ML injection  ?  Sig: Inject 1 mL (1,000 mcg total) into the muscle every 30 (thirty) days.  ?  Dispense:  3 mL  ?  Refill:  3  ? Syringe/Needle, Disp, (SYRINGE 3CC/25GX1") 25G X 1" 3 ML MISC  ?  Sig: 1 each by Does not apply route every 30 (thirty) days.  ?  Dispense:  10 each  ?  Refill:  1  ? Vitamin D, Ergocalciferol, (DRISDOL) 1.25 MG (50000 UNIT) CAPS capsule  ?  Sig: Take 1 capsule (50,000 Units total) by mouth every 30 (thirty) days.  ?  Dispense:  3 capsule  ?  Refill:  3  ? ?  ?Wellington Hampshire, MD ? ? ?

## 2021-08-01 NOTE — Patient Instructions (Signed)
Welcome to [facility] at [place]! It was a pleasure meeting you today. ° °As discussed, Please schedule a 12 month follow up visit today. ° °PLEASE NOTE: ° °If you had any LAB tests please let us know if you have not heard back within a few days. You may see your results on MyChart before we have a chance to review them but we will give you a call once they are reviewed by us. If we ordered any REFERRALS today, please let us know if you have not heard from their office within the next week.  °Let us know through MyChart if you are needing REFILLS, or have your pharmacy send us the request. You can also use MyChart to communicate with me or any office staff. ° °Please try these tips to maintain a healthy lifestyle: ° °Eat most of your calories during the day when you are active. Eliminate processed foods including packaged sweets (pies, cakes, cookies), reduce intake of potatoes, white bread, white pasta, and white rice. Look for whole grain options, oat flour or almond flour. ° °Each meal should contain half fruits/vegetables, one quarter protein, and one quarter carbs (no bigger than a computer mouse). ° °Cut down on sweet beverages. This includes juice, soda, and sweet tea. Also watch fruit intake, though this is a healthier sweet option, it still contains natural sugar! Limit to 3 servings daily. ° °Drink at least 1 glass of water with each meal and aim for at least 8 glasses per day ° °Exercise at least 150 minutes every week.   °

## 2021-08-02 LAB — HEPATITIS C ANTIBODY
Hepatitis C Ab: NONREACTIVE
SIGNAL TO CUT-OFF: 0.09 (ref <1.00)

## 2021-08-11 DIAGNOSIS — Z1151 Encounter for screening for human papillomavirus (HPV): Secondary | ICD-10-CM | POA: Diagnosis not present

## 2021-08-11 DIAGNOSIS — Z01419 Encounter for gynecological examination (general) (routine) without abnormal findings: Secondary | ICD-10-CM | POA: Diagnosis not present

## 2021-08-11 DIAGNOSIS — Z124 Encounter for screening for malignant neoplasm of cervix: Secondary | ICD-10-CM | POA: Diagnosis not present

## 2021-08-11 DIAGNOSIS — Z6831 Body mass index (BMI) 31.0-31.9, adult: Secondary | ICD-10-CM | POA: Diagnosis not present

## 2021-08-12 LAB — HM PAP SMEAR: HPV, high-risk: NEGATIVE

## 2021-08-23 DIAGNOSIS — N921 Excessive and frequent menstruation with irregular cycle: Secondary | ICD-10-CM | POA: Diagnosis not present

## 2021-11-17 DIAGNOSIS — N840 Polyp of corpus uteri: Secondary | ICD-10-CM | POA: Diagnosis not present

## 2021-11-17 DIAGNOSIS — N921 Excessive and frequent menstruation with irregular cycle: Secondary | ICD-10-CM | POA: Diagnosis not present

## 2021-12-12 ENCOUNTER — Encounter: Payer: Self-pay | Admitting: *Deleted

## 2022-03-02 ENCOUNTER — Encounter: Payer: Self-pay | Admitting: *Deleted

## 2022-04-18 DIAGNOSIS — N911 Secondary amenorrhea: Secondary | ICD-10-CM | POA: Diagnosis not present

## 2022-04-18 DIAGNOSIS — E049 Nontoxic goiter, unspecified: Secondary | ICD-10-CM | POA: Diagnosis not present

## 2022-04-20 ENCOUNTER — Other Ambulatory Visit: Payer: Self-pay | Admitting: Endocrinology

## 2022-04-20 DIAGNOSIS — E049 Nontoxic goiter, unspecified: Secondary | ICD-10-CM

## 2022-05-01 ENCOUNTER — Ambulatory Visit
Admission: RE | Admit: 2022-05-01 | Discharge: 2022-05-01 | Disposition: A | Discharge status: Home / Self Care | Payer: BC Managed Care – PPO | Source: Ambulatory Visit | Attending: Endocrinology | Admitting: Endocrinology

## 2022-05-01 DIAGNOSIS — E049 Nontoxic goiter, unspecified: Secondary | ICD-10-CM

## 2022-05-01 DIAGNOSIS — E041 Nontoxic single thyroid nodule: Secondary | ICD-10-CM | POA: Diagnosis not present

## 2022-06-06 ENCOUNTER — Encounter: Payer: Self-pay | Admitting: Family Medicine

## 2022-06-06 ENCOUNTER — Ambulatory Visit: Payer: BC Managed Care – PPO | Admitting: Family Medicine

## 2022-06-06 VITALS — BP 110/70 | HR 64 | Temp 98.1°F | Ht 64.0 in | Wt 186.1 lb

## 2022-06-06 DIAGNOSIS — M25551 Pain in right hip: Secondary | ICD-10-CM | POA: Diagnosis not present

## 2022-06-06 MED ORDER — MELOXICAM 15 MG PO TABS: 15.0000 mg | ORAL_TABLET | Freq: Every day | ORAL | 0 refills | Status: DC

## 2022-06-06 NOTE — Patient Instructions (Signed)
[facility] at [facility]  [address] on the 1st floor Phone number [phone]   Time the B12 for May 1st

## 2022-06-06 NOTE — Progress Notes (Signed)
Subjective:     Patient ID: Kelly Ramsey, female    DOB: 06/15/1980, 42 y.o.   MRN: [id number]  Chief Complaint  Patient presents with   Hip Pain    Right hip pain x 2 weeks that radiates down to knee at times    HPI R hip pain for 2 wks.  Was greater trochanter side, then into back and to move leg-pain groin area.  Can rad to knee.  Some plantar fasciitis. If stands too much pain.  Hard to sleep some.  Has to sleep on L or back. Ibu for 3 days, no change. No n/t.  No weakness.  During New Year, danced and some pain but resolved.   There are no preventive care reminders to display for this patient.   Past Medical History:  Diagnosis Date   B12 deficiency    Fibroids    Thyroid nodule    Vitamin D deficiency     Past Surgical History:  Procedure Laterality Date   CESAREAN SECTION  2009   GUM SURGERY  2022   TUBAL LIGATION      Outpatient Medications Prior to Visit  Medication Sig Dispense Refill   Syringe/Needle, Disp, (SYRINGE 3CC/25GX1") 25G X 1" 3 ML MISC 1 each by Does not apply route every 30 (thirty) days. 10 each 1   cyanocobalamin (,VITAMIN B-12,) 1000 MCG/ML injection Inject 1 mL (1,000 mcg total) into the muscle every 30 (thirty) days. (Patient not taking: Reported on 06/06/2022) 3 mL 3   VITAMIN D PO Take 50,000 Units by mouth every 30 (thirty) days. (Patient not taking: Reported on 06/06/2022)     Vitamin D, Ergocalciferol, (DRISDOL) 1.25 MG (50000 UNIT) CAPS capsule Take 1 capsule (50,000 Units total) by mouth every 30 (thirty) days. (Patient not taking: Reported on 06/06/2022) 3 capsule 3   No facility-administered medications prior to visit.    Allergies  Allergen Reactions   Acetaminophen Itching    In her throat   Penicillins Other (See Comments) and Rash   ROS neg/noncontributory except as noted HPI/below      Objective:     BP 110/70   Pulse 64   Temp 98.1 F (36.7 C) (Temporal)   Ht 5\' 4"  (1.626 m)   Wt 186 lb 2 oz (84.4 kg)    LMP 05/06/2022 (Exact Date)   SpO2 99%   Breastfeeding No   BMI 31.95 kg/m  Wt Readings from Last 3 Encounters:  06/06/22 186 lb 2 oz (84.4 kg)  08/01/21 185 lb 8 oz (84.1 kg)  04/30/15 178 lb 3.2 oz (80.8 kg)    Physical Exam   Gen: WDWN NAD HEENT: NCAT, conjunctiva not injected, sclera nonicteric EXT:  no edema MSK: no gross abnormalities.  NEURO: A&O x3.  CN II-XII intact.  PSYCH: normal mood. Good eye contact Lumbar spine-no TTP  no SI tenderness. Some pain to flex/ext R hip.  Some TTP groin, lateral side of hip.  Pain to rotate as well.      Assessment & Plan:   Problem List Items Addressed This Visit   None Visit Diagnoses     Pain of right hip    -  Primary     Pain R hip-meloxicam 15mg  daily prn.  See sports med-contact info given.  Suspect arthritis but pt young. Could be bursitis, etc.   Meds ordered this encounter  Medications   meloxicam (MOBIC) 15 MG tablet    Sig: Take 1 tablet (15 mg total) by  mouth daily.    Dispense:  30 tablet    Refill:  0    Wellington Hampshire, MD

## 2022-06-07 ENCOUNTER — Ambulatory Visit (INDEPENDENT_AMBULATORY_CARE_PROVIDER_SITE_OTHER): Payer: BC Managed Care – PPO

## 2022-06-07 ENCOUNTER — Ambulatory Visit (INDEPENDENT_AMBULATORY_CARE_PROVIDER_SITE_OTHER): Payer: BC Managed Care – PPO | Admitting: Sports Medicine

## 2022-06-07 VITALS — BP 110/82 | HR 70 | Ht 64.0 in | Wt 186.0 lb

## 2022-06-07 DIAGNOSIS — G8929 Other chronic pain: Secondary | ICD-10-CM

## 2022-06-07 DIAGNOSIS — M25551 Pain in right hip: Secondary | ICD-10-CM | POA: Diagnosis not present

## 2022-06-07 NOTE — Patient Instructions (Addendum)
Xrays on the way out  Hip HEP  - Start meloxicam 15 mg daily x2 weeks.  If still having pain after 2 weeks, complete 3rd-week of meloxicam. May use remaining meloxicam as needed once daily for pain control.  Do not to use additional NSAIDs while taking meloxicam.  May use Tylenol [phone] mg 2 to 3 times a day for breakthrough pain. Can pick up today  3-4 week follow up

## 2022-06-07 NOTE — Progress Notes (Signed)
Benito Mccreedy D.[place] Phone: [phone]   Assessment and Plan:     1. Chronic right hip pain  -Chronic with exacerbation, initial sports medicine visit - Patient presenting with right hip pain that patient believes started on 03/20/2022 after further discussion, has been intermittent since then, and more consistent over the past 3 to 4 weeks - Most consistent with intra-articular hip pathology based on physical exam - We will obtain x-rays today.  Can discuss further at follow-up visit - Start meloxicam 15 mg daily x2 weeks.  If still having pain after 2 weeks, complete 3rd-week of meloxicam. May use remaining meloxicam as needed once daily for pain control.  Do not to use additional NSAIDs while taking meloxicam.  May use Tylenol [phone] mg 2 to 3 times a day for breakthrough pain.  Medication was   prescribed yesterday by Dr. Cherlynn Kaiser -Start HEP for hip  Pertinent previous records reviewed include family medicine note 06/06/2022   Follow Up: 3-4 weeks for reevaluation.  Would review hip x-ray.  Could consider physical therapy versus intra-articular CSI   Subjective:   I, Moenique Parris, am serving as a Education administrator for Doctor Glennon Mac  Chief Complaint: hip pain   HPI:   06/07/22 Patient is a 42 year old female complaining of hip pain. Patient states R hip pain for 2 wks.  Was greater trochanter side, then into back and to move leg-pain groin area.  Sometimes radiates  to knee and to the front when she flexes her knee .  Hard to sleep some.  Has to sleep on L or back. Ibu for the pain and that helps sometimes ,no numbness or tingling.    Relevant Historical Information:   None pertinent  Additional pertinent review of systems negative.   Current Outpatient Medications:    cyanocobalamin (,VITAMIN B-12,) 1000 MCG/ML injection, Inject 1 mL (1,000 mcg total) into the muscle every 30 (thirty) days., Disp:  3 mL, Rfl: 3   meloxicam (MOBIC) 15 MG tablet, Take 1 tablet (15 mg total) by mouth daily., Disp: 30 tablet, Rfl: 0   Syringe/Needle, Disp, (SYRINGE 3CC/25GX1") 25G X 1" 3 ML MISC, 1 each by Does not apply route every 30 (thirty) days., Disp: 10 each, Rfl: 1   VITAMIN D PO, Take 50,000 Units by mouth every 30 (thirty) days., Disp: , Rfl:    Vitamin D, Ergocalciferol, (DRISDOL) 1.25 MG (50000 UNIT) CAPS capsule, Take 1 capsule (50,000 Units total) by mouth every 30 (thirty) days., Disp: 3 capsule, Rfl: 3   Objective:     Vitals:   06/07/22 0814  BP: 110/82  Pulse: 70  SpO2: 100%  Weight: 186 lb (84.4 kg)  Height: 5\' 4"  (1.626 m)      Body mass index is 31.93 kg/m.    Physical Exam:    General: awake, alert, and oriented no acute distress, nontoxic Skin: no suspicious lesions or rashes Neuro:sensation intact distally with no deficits, normal muscle tone, no atrophy, strength 5/5 in all tested lower ext groups Psych: normal mood and affect, speech clear   Right Hip: No deformity, swelling or wasting ROM Flexion 90, ext 30, IR 45, ER 45 TTP Glut NTTP over the hip flexors, greater trochanter, gluteal musculature, si joint, lumbar spine Negative log roll with FROM + FABER + FADIR Negative Piriformis test Negative trendelenberg Gait normal    Electronically signed by:  Benito Mccreedy D.[facility]  8:40 AM 06/07/22

## 2022-06-27 NOTE — Progress Notes (Deleted)
    Kelly [facility] D.[facility] [address] [place] [address] Phone: [phone]   Assessment and Plan:     There are no diagnoses linked to this encounter.  ***   Pertinent previous records reviewed include ***   Follow Up: ***     Subjective:   I, Kelly Ramsey, am serving as a Neurosurgeon for Doctor Richardean Sale   Chief Complaint: hip pain    HPI:    06/07/22 Patient is a 42 year old female complaining of hip pain. Patient states R hip pain for 2 wks.  Was greater trochanter side, then into back and to move leg-pain groin area.  Sometimes radiates  to knee and to the front when she flexes her knee .  Hard to sleep some.  Has to sleep on L or back. Ibu for the pain and that helps sometimes ,no numbness or tingling.    06/28/2022 Patient states    Relevant Historical Information:   None pertinent    Additional pertinent review of systems negative.   Current Outpatient Medications:    cyanocobalamin (,VITAMIN B-12,) 1000 MCG/ML injection, Inject 1 mL (1,000 mcg total) into the muscle every 30 (thirty) days., Disp: 3 mL, Rfl: 3   meloxicam (MOBIC) 15 MG tablet, Take 1 tablet (15 mg total) by mouth daily., Disp: 30 tablet, Rfl: 0   Syringe/Needle, Disp, (SYRINGE 3CC/25GX1") 25G X 1" 3 ML MISC, 1 each by Does not apply route every 30 (thirty) days., Disp: 10 each, Rfl: 1   VITAMIN D PO, Take 50,000 Units by mouth every 30 (thirty) days., Disp: , Rfl:    Vitamin D, Ergocalciferol, (DRISDOL) 1.25 MG (50000 UNIT) CAPS capsule, Take 1 capsule (50,000 Units total) by mouth every 30 (thirty) days., Disp: 3 capsule, Rfl: 3   Objective:     There were no vitals filed for this visit.    There is no height or weight on file to calculate BMI.    Physical Exam:    ***   Electronically signed by:  Kelly [facility] D.[facility] 7:31 AM 06/27/22

## 2022-06-28 ENCOUNTER — Ambulatory Visit: Payer: BC Managed Care – PPO | Admitting: Sports Medicine

## 2022-07-04 NOTE — Progress Notes (Unsigned)
Aleen Sells D.[facility] [address] [place] [address] Phone: [phone]   Assessment and Plan:    1. Chronic right hip pain 2. Greater trochanteric bursitis, right  -Chronic with exacerbation, subsequent sports medicine visit  - Overall improvement with HEP and course of meloxicam, however patient continuing to have pain primarily around lateral and posterior hip especially with new yoga activities - Reviewed patient's x-ray with patient showing no significant changes of right hip, an incidental finding of bone spur left-sided superior pubic ramus - Discontinue meloxicam and use remainder as needed - May use Tylenol as needed for day-to-day pain relief - Continue HEP and start physical therapy.  Referral sent - Symptoms are most consistent with greater trochanteric bursitis caused by gluteal strain based on today's physical exam, so patient elected for greater trochanteric CSI.  Tolerated well per note below  Procedure: Greater trochanteric bursal injection Side: Right  Risks explained and consent was given verbally. The site was cleaned with alcohol prep. A steroid injection was performed with patient in the lateral side-lying position at area of maximum tenderness over greater trochanter using 2mL of 1% lidocaine without epinephrine and 1mL of kenalog /ml. This was well tolerated and resulted in symptomatic relief.  Needle was removed, hemostasis achieved, and post injection instructions were explained.  Pt was advised to call or return to clinic if these symptoms worsen or fail to improve as anticipated.    Pertinent previous records reviewed include none   Follow Up: 3 to 4 weeks for reevaluation.  If no improvement or worsening of symptoms, could consider intra-articular CSI versus advanced imaging versus prednisone course   Subjective:   I, Arion Shankles, am serving as a Neurosurgeon for Doctor Richardean Sale   Chief Complaint:  hip pain    HPI:    06/07/22 Patient is a 42 year old female complaining of hip pain. Patient states R hip pain for 2 wks.  Was greater trochanter side, then into back and to move leg-pain groin area.  Sometimes radiates  to knee and to the front when she flexes her knee .  Hard to sleep some.  Has to sleep on L or back. Ibu for the pain and that helps sometimes ,no numbness or tingling.    07/05/2022 Patient states that she is good     Relevant Historical Information:   None pertinent  Additional pertinent review of systems negative.   Current Outpatient Medications:    cyanocobalamin (,VITAMIN B-12,) 1000 MCG/ML injection, Inject 1 mL (1,000 mcg total) into the muscle every 30 (thirty) days., Disp: 3 mL, Rfl: 3   meloxicam (MOBIC) 15 MG tablet, Take 1 tablet (15 mg total) by mouth daily., Disp: 30 tablet, Rfl: 0   Syringe/Needle, Disp, (SYRINGE 3CC/25GX1") 25G X 1" 3 ML MISC, 1 each by Does not apply route every 30 (thirty) days., Disp: 10 each, Rfl: 1   VITAMIN D PO, Take 50,000 Units by mouth every 30 (thirty) days., Disp: , Rfl:    Vitamin D, Ergocalciferol, (DRISDOL) 1.25 MG (50000 UNIT) CAPS capsule, Take 1 capsule (50,000 Units total) by mouth every 30 (thirty) days., Disp: 3 capsule, Rfl: 3   Objective:     Vitals:   07/05/22 1435  Pulse: (!) 44  SpO2: 98%  Weight: 183 lb (83 kg)  Height:  (1.626 m)      Body mass index is 31.41 kg/m.    Physical Exam:    General: awake,  alert, and oriented no acute distress, nontoxic Skin: no suspicious lesions or rashes Neuro:sensation intact distally with no deficits, normal muscle tone, no atrophy, strength 5/5 in all tested lower ext groups Psych: normal mood and affect, speech clear   Right hip: No deformity, swelling or wasting ROM Flexion 90, ext 30, IR 45, ER 45 TTP right gluteal musculature and right greater trochanter NTTP over the hip flexors,   si joint, lumbar spine Negative log roll with FROM Negative  FABER Positive FADIR for lateral hip pain Negative Piriformis test Negative trendelenberg Gait normal    Electronically signed by:  Aleen Sells D.[facility] 4:28 PM 07/05/22

## 2022-07-05 ENCOUNTER — Ambulatory Visit: Payer: BC Managed Care – PPO | Admitting: Sports Medicine

## 2022-07-05 VITALS — HR 44 | Ht 64.0 in | Wt 183.0 lb

## 2022-07-05 DIAGNOSIS — G8929 Other chronic pain: Secondary | ICD-10-CM

## 2022-07-05 DIAGNOSIS — M25551 Pain in right hip: Secondary | ICD-10-CM

## 2022-07-05 DIAGNOSIS — M7061 Trochanteric bursitis, right hip: Secondary | ICD-10-CM

## 2022-07-05 NOTE — Patient Instructions (Addendum)
Good to see you Discontinue meloxicam use remainder as needed  Tylenol as needed for pain relief Continue HEP  Call us if you would like physical therapy  3 week follow up

## 2022-07-08 ENCOUNTER — Other Ambulatory Visit: Payer: Self-pay | Admitting: Family Medicine

## 2022-08-03 ENCOUNTER — Ambulatory Visit (INDEPENDENT_AMBULATORY_CARE_PROVIDER_SITE_OTHER): Payer: BC Managed Care – PPO | Admitting: Family Medicine

## 2022-08-03 ENCOUNTER — Encounter: Payer: Self-pay | Admitting: Family Medicine

## 2022-08-03 VITALS — BP 100/80 | HR 73 | Temp 98.2°F | Resp 16 | Ht 64.0 in | Wt 183.5 lb

## 2022-08-03 DIAGNOSIS — E559 Vitamin D deficiency, unspecified: Secondary | ICD-10-CM

## 2022-08-03 DIAGNOSIS — E041 Nontoxic single thyroid nodule: Secondary | ICD-10-CM

## 2022-08-03 DIAGNOSIS — Z0000 Encounter for general adult medical examination without abnormal findings: Secondary | ICD-10-CM | POA: Diagnosis not present

## 2022-08-03 DIAGNOSIS — E538 Deficiency of other specified B group vitamins: Secondary | ICD-10-CM | POA: Diagnosis not present

## 2022-08-03 DIAGNOSIS — R6884 Jaw pain: Secondary | ICD-10-CM

## 2022-08-03 LAB — COMPREHENSIVE METABOLIC PANEL
ALT: 12 U/L (ref 0–35)
AST: 15 U/L (ref 0–37)
Albumin: 4.3 g/dL (ref 3.5–5.2)
Alkaline Phosphatase: 83 U/L (ref 39–117)
BUN: 15 mg/dL (ref 6–23)
CO2: 28 mEq/L (ref 19–32)
Calcium: 9.7 mg/dL (ref 8.4–10.5)
Chloride: 103 mEq/L (ref 96–112)
Creatinine, Ser: 0.89 mg/dL (ref 0.40–1.20)
GFR: 80 mL/min (ref >60.00)
Glucose, Bld: 90 mg/dL (ref 70–99)
Potassium: 4.4 mEq/L (ref 3.5–5.1)
Sodium: 139 mEq/L (ref 135–145)
Total Bilirubin: 0.8 mg/dL (ref 0.2–1.2)
Total Protein: 7 g/dL (ref 6.0–8.3)

## 2022-08-03 LAB — CBC WITH DIFFERENTIAL/PLATELET
Basophils Absolute: 0 10*3/uL (ref 0.0–0.1)
Basophils Relative: 0.5 % (ref 0.0–3.0)
Eosinophils Absolute: 0.1 10*3/uL (ref 0.0–0.7)
Eosinophils Relative: 1.1 % (ref 0.0–5.0)
HCT: 39.7 % (ref 36.0–46.0)
Hemoglobin: 13.3 g/dL (ref 12.0–15.0)
Lymphocytes Relative: 23.3 % (ref 12.0–46.0)
Lymphs Abs: 1.4 10*3/uL (ref 0.7–4.0)
MCHC: 33.5 g/dL (ref 30.0–36.0)
MCV: 86.9 fl (ref 78.0–100.0)
Monocytes Absolute: 0.3 10*3/uL (ref 0.1–1.0)
Monocytes Relative: 5.7 % (ref 3.0–12.0)
Neutro Abs: 4.1 10*3/uL (ref 1.4–7.7)
Neutrophils Relative %: 69.4 % (ref 43.0–77.0)
Platelets: 254 10*3/uL (ref 150.0–400.0)
RBC: 4.57 Mil/uL (ref 3.87–5.11)
RDW: 14.5 % (ref 11.5–15.5)
WBC: 5.9 10*3/uL (ref 4.0–10.5)

## 2022-08-03 LAB — LIPID PANEL
Cholesterol: 178 mg/dL (ref 0–200)
HDL: 72.2 mg/dL (ref >39.00)
LDL Cholesterol: 93 mg/dL (ref 0–99)
NonHDL: 106.06
Total CHOL/HDL Ratio: 2
Triglycerides: 66 mg/dL (ref 0.0–149.0)
VLDL: 13.2 mg/dL (ref 0.0–40.0)

## 2022-08-03 LAB — TSH: TSH: 1.18 u[IU]/mL (ref 0.35–5.50)

## 2022-08-03 LAB — HEMOGLOBIN A1C: Hgb A1c MFr Bld: 5.3 % (ref 4.6–6.5)

## 2022-08-03 LAB — MAGNESIUM: Magnesium: 1.9 mg/dL (ref 1.5–2.5)

## 2022-08-03 LAB — VITAMIN D 25 HYDROXY (VIT D DEFICIENCY, FRACTURES): VITD: 19.63 ng/mL — ABNORMAL LOW (ref 30.00–100.00)

## 2022-08-03 LAB — VITAMIN B12: Vitamin B-12: 225 pg/mL (ref 211–911)

## 2022-08-03 NOTE — Patient Instructions (Addendum)
It was very nice to see you today!  Magnesium 200-400 mg daily  [facility]   PLEASE NOTE:  If you had any lab tests please let us know if you have not heard back within a few days. You may see your results on MyChart before we have a chance to review them but we will give you a call once they are reviewed by [place]. If we ordered any referrals today, please let us know if you have not heard from their office within the next week.   Please try these tips to maintain a healthy lifestyle:  Eat most of your calories during the day when you are active. Eliminate processed foods including packaged sweets (pies, cakes, cookies), reduce intake of potatoes, white bread, white pasta, and white rice. Look for whole grain options, oat flour or almond flour.  Each meal should contain half fruits/vegetables, one quarter protein, and one quarter carbs (no bigger than a computer mouse).  Cut down on sweet beverages. This includes juice, soda, and sweet tea. Also watch fruit intake, though this is a healthier sweet option, it still contains natural sugar! Limit to 3 servings daily.  Drink at least 1 glass of water with each meal and aim for at least 8 glasses per day  Exercise at least 150 minutes every week.

## 2022-08-03 NOTE — Progress Notes (Signed)
Phone [phone]   Subjective:   Patient is a 42 y.o. female presenting for annual physical.    Chief Complaint  Patient presents with   Annual Exam    CPE Fasting Haven't has a period since February after polyp surgery   Annual-weight went to 176 in the summer but stopped exercising 1st of year and hip pain.  Now back to exercise.  Some diet changes.  Drinks water.  No menses since polyp removal in February-had endomet polyp removed in August, period in Feb and none since  menses irreg past 1 year(s).  B12 deficiency-eats chick 2x/week(s). No taking B12 for over 2 mo   Vitamin D deficiency-not taking for 2 months. 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  Sept.   See problem oriented charting- ROS- ROS: Gen: no fever, chills  Skin: no rash, itching ENT: no ear pain, ear drainage, nasal congestion, rhinorrhea, sinus pressure, sore throat  occasional right jaw pain-DDS said to follow up ENT Eyes: no blurry vision, double vision Resp: no cough, wheeze,SOB CV: no CP, palpitations, LE edema,  GI: no heartburn, n/v/d/c, abd pain GU: no dysuria, urgency, frequency, hematuria MSK: injection hip and helped.  Patient doing yoga so declined physical therapy  working on exercise. Plantar fasciitis.  Sometimes cramps in toes.  Neuro: no dizziness, headache, weakness, vertigo Psych: no depression, anxiety, insomnia, SI   The following were reviewed and entered/updated in epic: Past Medical History:  Diagnosis Date   B12 deficiency    Fibroids    Thyroid nodule    Vitamin D deficiency    Patient Active Problem List   Diagnosis Date Noted   Vitamin B12 deficiency 08/01/2021   Vitamin D deficiency 08/01/2021   Thyroid nodule 08/01/2021   KNEE PAIN, BILATERAL 03/01/2010   PES PLANUS 03/01/2010   WEIGHT GAIN 03/01/2010   Past Surgical History:  Procedure Laterality Date   BIOPSY ENDOMETRIAL  2023   polyp   CESAREAN SECTION  2009   GUM SURGERY  2022   TUBAL  LIGATION      Family History  Problem Relation Age of Onset   Hypertension Mother    Diabetes Mother    Arthritis Mother    Hypertension Father    Diabetes Father    Cancer Paternal Grandfather     Medications- reviewed and updated Current Outpatient Medications  Medication Sig Dispense Refill   cyanocobalamin (,VITAMIN B-12,) 1000 MCG/ML injection Inject 1 mL (1,000 mcg total) into the muscle every 30 (thirty) days. (Patient not taking: Reported on 08/03/2022) 3 mL 3   meloxicam (MOBIC) 15 MG tablet TAKE 1 TABLET (15 MG TOTAL) BY MOUTH DAILY. (Patient not taking: Reported on 08/03/2022) 30 tablet 0   Syringe/Needle, Disp, (SYRINGE 3CC/25GX1") 25G X 1" 3 ML MISC 1 each by Does not apply route every 30 (thirty) days. (Patient not taking: Reported on 08/03/2022) 10 each 1   VITAMIN D PO Take 50,000 Units by mouth every 30 (thirty) days. (Patient not taking: Reported on 08/03/2022)     Vitamin D, Ergocalciferol, (DRISDOL) 1.25 MG (50000 UNIT) CAPS capsule Take 1 capsule (50,000 Units total) by mouth every 30 (thirty) days. (Patient not taking: Reported on 08/03/2022) 3 capsule 3   No current facility-administered medications for this visit.    Allergies-reviewed and updated Allergies  Allergen Reactions   Acetaminophen Itching    In her throat   Penicillins Other (See Comments) and Rash    Social History   Social History Narrative  Business analyst   Objective  Objective:  BP 100/80   Pulse 73   Temp 98.2 F (36.8 C) (Temporal)   Resp 16   Ht 5\' 4"  (1.626 m)   Wt 183 lb 8 oz (83.2 kg)   LMP 05/06/2022 (Exact Date)   SpO2 99%   BMI 31.50 kg/m  Physical Exam  Gen: WDWN NAD HEENT: NCAT, conjunctiva not injected, sclera nonicteric TM WNL B, OP moist, no exudates  NECK:  supple, + thyromegaly, no nodes, no carotid bruits CARDIAC: RRR, S1S2+, no murmur. DP 2+B LUNGS: CTAB. No wheezes ABDOMEN:  BS+, soft, NTND, No HSM, no masses EXT:  no edema MSK: no gross  abnormalities. MS 5/5 all 4 NEURO: A&O x3.  CN II-XII intact.  PSYCH: normal mood. Good eye contact  Mult lipomas    Assessment and Plan   Health Maintenance counseling: 1. Anticipatory guidance: Patient counseled regarding regular dental exams q6 months, eye exams,  avoiding smoking and second hand smoke, limiting alcohol to 1 beverage per day, no illicit drugs.   2. Risk factor reduction:  Advised patient of need for regular exercise and diet rich and fruits and vegetables to reduce risk of heart attack and stroke. Exercise- +.  Wt Readings from Last 3 Encounters:  08/03/22 183 lb 8 oz (83.2 kg)  07/05/22 183 lb (83 kg)  06/07/22 186 lb (84.4 kg)   3. Immunizations/screenings/ancillary studies Immunization History  Administered Date(s) Administered   Influenza Inj Mdck Quad Pf 05/05/2015   Influenza,inj,quad, With Preservative 12/24/2018   Influenza-Unspecified 05/05/2015, 12/24/2018   PFIZER(Purple Top)SARS-COV-2 Vaccination 06/05/2019, 06/26/2019   Tdap 11/18/2009, 03/05/2012, 07/28/2015   Unspecified SARS-COV-2 Vaccination 06/05/2019, 06/26/2019, 02/18/2020   There are no preventive care reminders to display for this patient.  4. Cervical cancer screening- utd 5. Breast cancer screening-  mammogram gyn 6. Colon cancer screening - n/a 7. Skin cancer screening- advised regular sunscreen use. Denies worrisome, changing, or new skin lesions.  8. Birth control/STD check- TL 9. Osteoporosis screening- n/a 10. Smoking associated screening - non smoker  Wellness examination -     Lipid panel -     Comprehensive metabolic panel -     CBC with Differential/Platelet -     Hemoglobin A1c -     TSH -     Vitamin B12 -     VITAMIN D 25 Hydroxy (Vit-D Deficiency, Fractures) -     Magnesium  Vitamin B12 deficiency -     Vitamin B12  Vitamin D deficiency -     VITAMIN D 25 Hydroxy (Vit-D Deficiency, Fractures)  Thyroid nodule   Wellness-anticipatory guidance.  Work on  Diet/Exercise  Check CBC,CMP,lipids,TSH, A1C.  F/u 1 yr  Vitamin D and B12 deficiency-not on supplements.  Checklevels  Recommended follow up: Return in about 1 year (around 08/03/2023) for annual physical.  Lab/Order associations:+ fasting  Angelena Sole, MD

## 2022-08-04 NOTE — Progress Notes (Signed)
Labs are great except B12 is low-needs to take B12 1000-2067mcg daily Vitamin D is low-can take 2000 iu/D or prescription 50000 weekly #8/0 and then OTC.

## 2022-08-08 ENCOUNTER — Other Ambulatory Visit: Payer: Self-pay | Admitting: *Deleted

## 2022-08-08 MED ORDER — VITAMIN D (ERGOCALCIFEROL) 1.25 MG (50000 UNIT) PO CAPS: 50000.0000 [IU] | ORAL_CAPSULE | ORAL | 0 refills | Status: DC

## 2022-08-08 MED ORDER — CYANOCOBALAMIN 1000 MCG/ML IJ SOLN: 1000.0000 ug | INTRAMUSCULAR | 3 refills | Status: DC

## 2022-08-08 MED ORDER — "BD DISP NEEDLE 25G X 1"" MISC": 0 refills | Status: AC

## 2023-02-01 DIAGNOSIS — Z299 Encounter for prophylactic measures, unspecified: Secondary | ICD-10-CM | POA: Diagnosis not present

## 2023-02-01 DIAGNOSIS — N390 Urinary tract infection, site not specified: Secondary | ICD-10-CM | POA: Diagnosis not present

## 2023-03-27 DIAGNOSIS — Z6832 Body mass index (BMI) 32.0-32.9, adult: Secondary | ICD-10-CM | POA: Diagnosis not present

## 2023-03-27 DIAGNOSIS — Z01419 Encounter for gynecological examination (general) (routine) without abnormal findings: Secondary | ICD-10-CM | POA: Diagnosis not present

## 2023-03-28 IMAGING — US US THYROID
1 series · 13 of 25 positions shown · non-contrast
Comparison: November 2018, multiple priors

CLINICAL DATA: Prior ultrasound follow-up. Previous biopsy of right
thyroid gland nodule July 2011

EXAM:
THYROID ULTRASOUND
TECHNIQUE: Ultrasound examination of the thyroid gland and adjacent soft
tissues was performed.

[Series 1: us thyroid · 0.09mm/px · 13 of 45 slices shown]
[im 1/45]
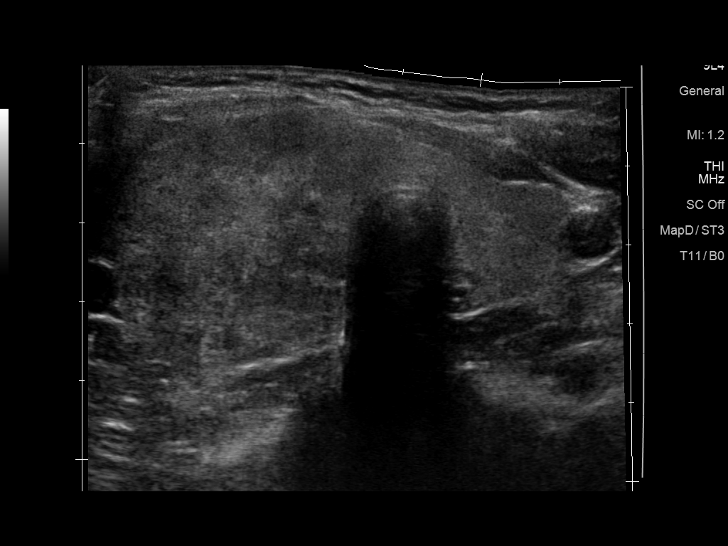
[im 4/45]
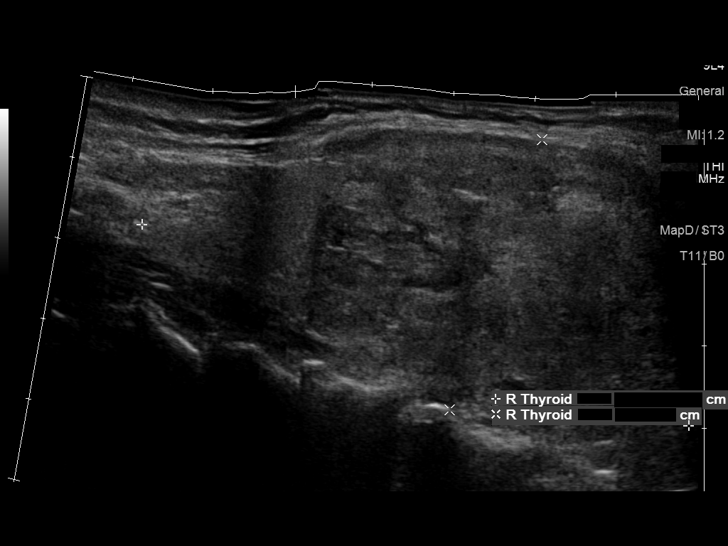
[im 8/45]
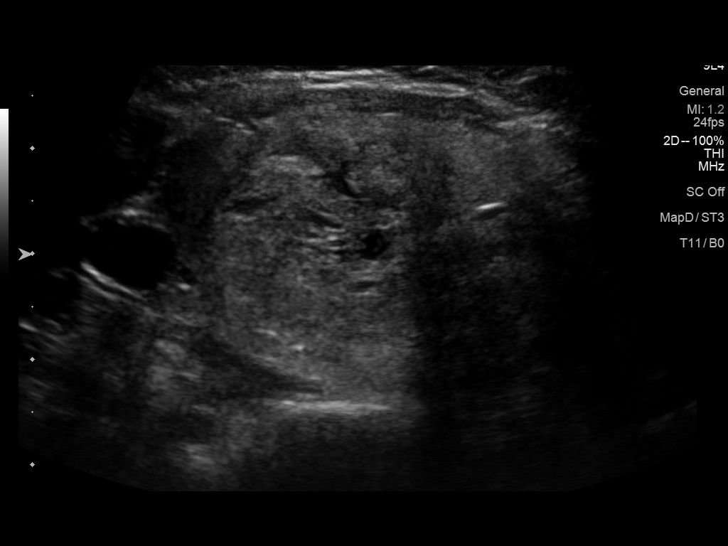
[im 12/45]
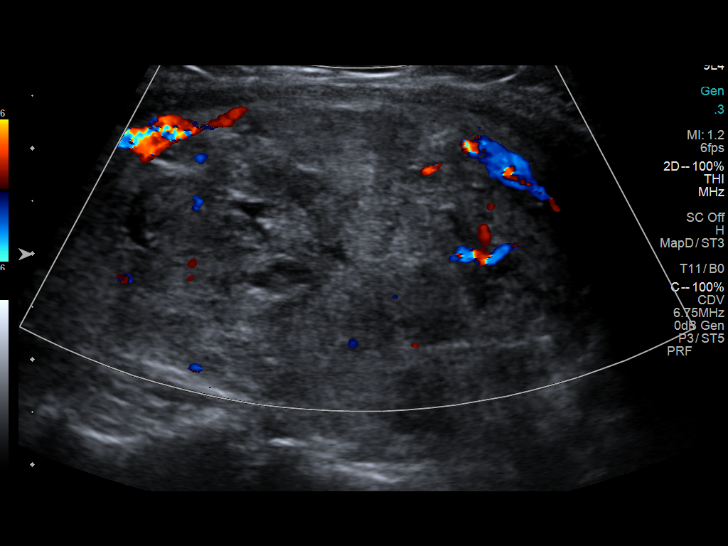
[im 15/45]
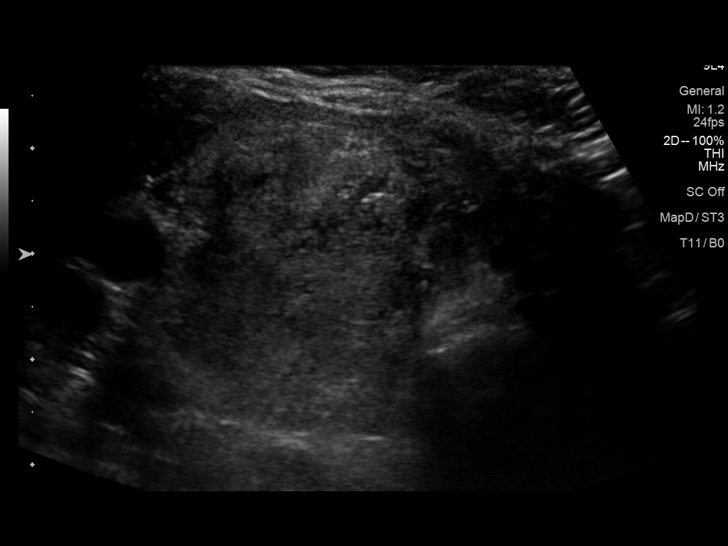
[im 19/45]
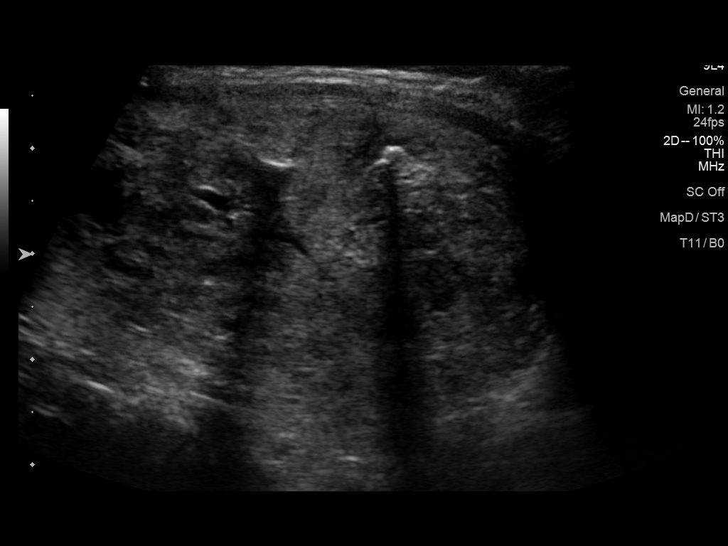
[im 23/45]
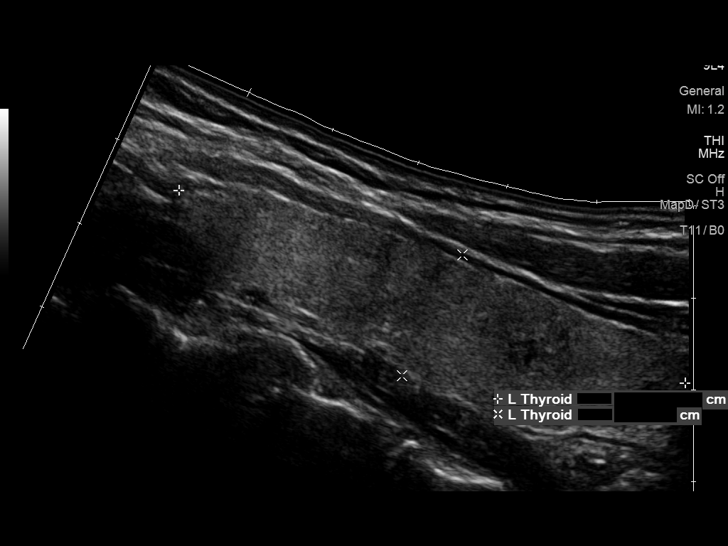
[im 26/45]
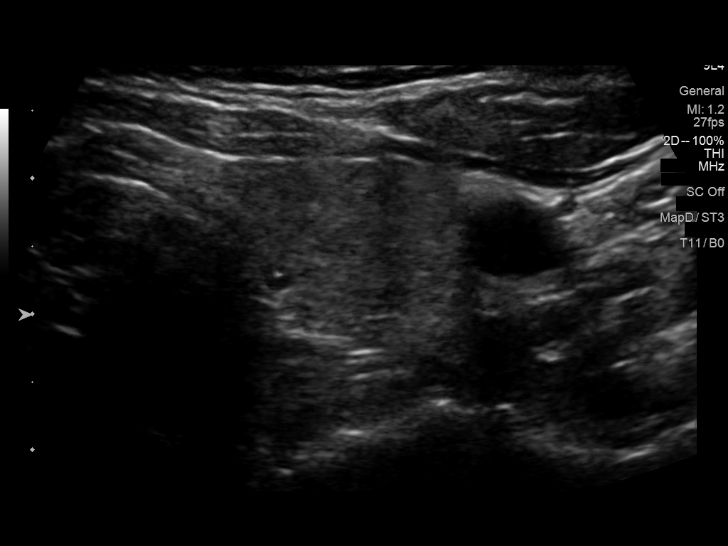
[im 30/45]
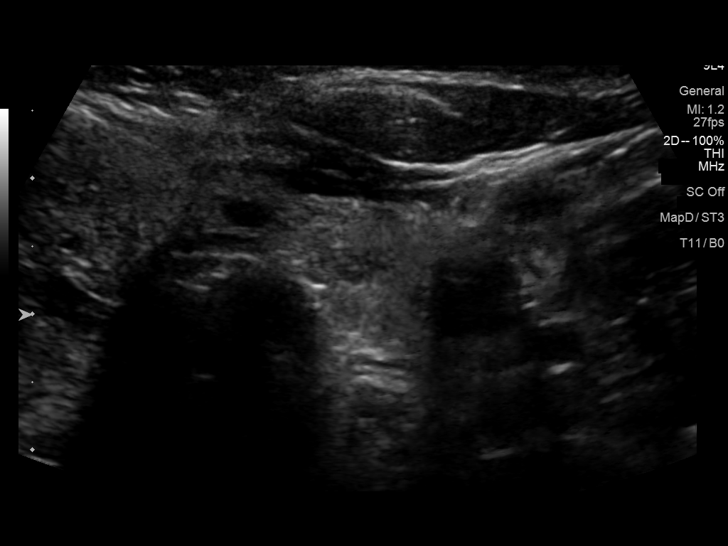
[im 34/45]
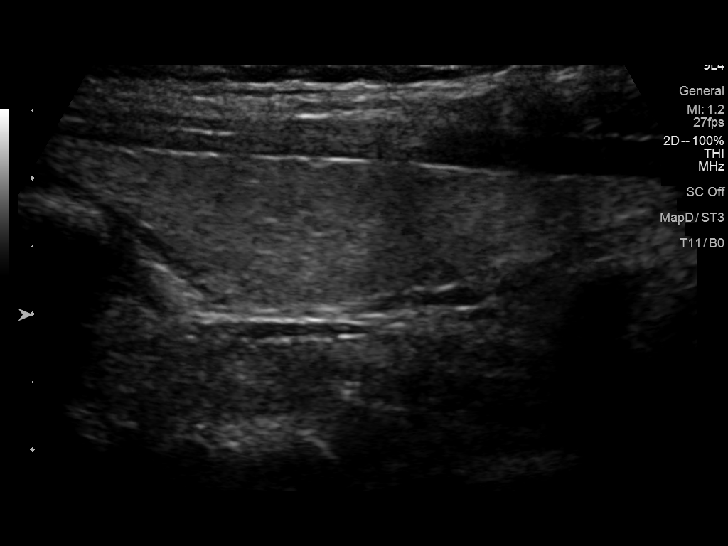
[im 37/45]
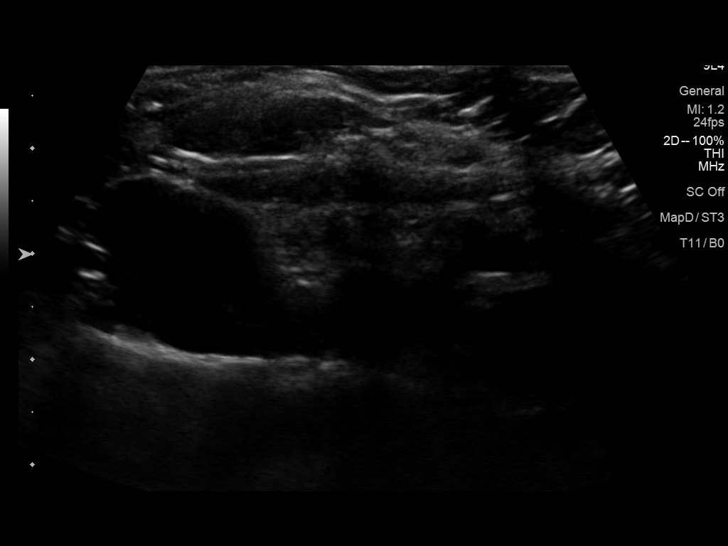
[im 41/45]
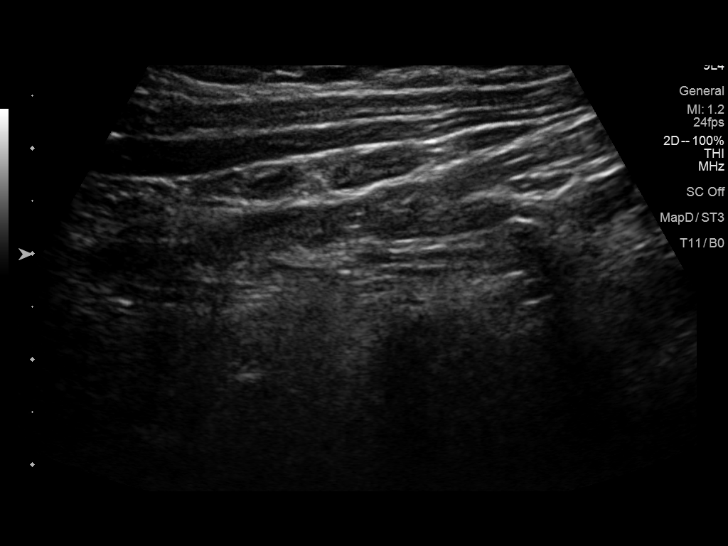
[im 45/45]
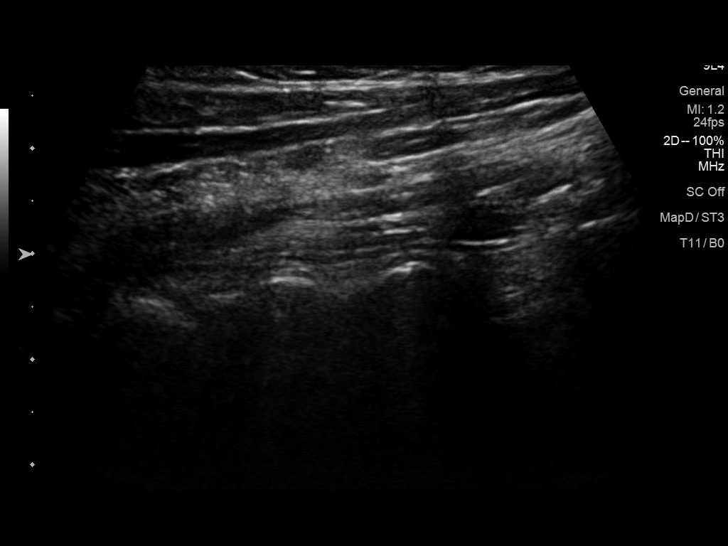

[13 of 25 positions shown; findings below may reference images not displayed]

FINDINGS: Parenchymal Echotexture: Mildly heterogenous

Isthmus: 0.4 cm

Right lobe: 7.1 x 3.5 x 3.5 cm

Left lobe: 6.0 x 1.5 x 1.8 cm

_________________________________________________________

Estimated total number of nodules >/= 1 cm: 1

Number of spongiform nodules >/=  2 cm not described below (TR1): 0

Number of mixed cystic and solid nodules >/= 1.5 cm not described
below (TR2): 0

_________________________________________________________

Nodule labeled 1 refers to the previously biopsied large solid
isoechoic nodule with scattered microcalcifications (TR 4) in the
mid right thyroid lobe measuring 4.8 x 3.7 x 2.5 cm, previously
measuring up to 4.1 cm in 5656 and 4.6 cm in 0274. it does not
appear significantly changed in morphology. It was previously
biopsied in July 2011.

Nodule labeled 2 refers to a small subcentimeter solid isoechoic
nodule (TR 3) in the left thyroid lobe. Given size (<1.4 cm) and
appearance, this nodule does NOT meet TI-RADS criteria for biopsy or
dedicated follow-up.
IMPRESSION: 1. Enlarged multinodular thyroid gland. No new suspicious thyroid
nodules.
2. The large nodule in the right thyroid lobe measures slightly
larger on today's examination when compared to most recent prior
(although it remains somewhat similar to the 0274 exam). It is felt
that these differences in size measurements between serial
examinations may be due to variations in imaging technique. Overall,
it does not appear significantly changed in morphology. This nodule
was previously biopsied in 8006, correlate with biopsy results.

The above is in keeping with the ACR TI-RADS recommendations - [HOSPITAL] 4994;[DATE].

## 2023-04-19 ENCOUNTER — Other Ambulatory Visit: Payer: Self-pay | Admitting: Endocrinology

## 2023-04-19 DIAGNOSIS — N911 Secondary amenorrhea: Secondary | ICD-10-CM | POA: Diagnosis not present

## 2023-04-19 DIAGNOSIS — E049 Nontoxic goiter, unspecified: Secondary | ICD-10-CM | POA: Diagnosis not present

## 2023-04-19 DIAGNOSIS — E88819 Insulin resistance, unspecified: Secondary | ICD-10-CM | POA: Diagnosis not present

## 2023-04-24 ENCOUNTER — Ambulatory Visit
Admission: RE | Admit: 2023-04-24 | Discharge: 2023-04-24 | Disposition: A | Discharge status: Home / Self Care | Payer: BC Managed Care – PPO | Source: Ambulatory Visit | Attending: Endocrinology | Admitting: Endocrinology

## 2023-04-24 ENCOUNTER — Other Ambulatory Visit: Payer: Self-pay | Admitting: Endocrinology

## 2023-04-24 DIAGNOSIS — E049 Nontoxic goiter, unspecified: Secondary | ICD-10-CM

## 2023-04-24 DIAGNOSIS — E041 Nontoxic single thyroid nodule: Secondary | ICD-10-CM | POA: Diagnosis not present

## 2023-05-09 ENCOUNTER — Ambulatory Visit: Payer: Self-pay | Admitting: Family Medicine

## 2023-05-09 NOTE — Telephone Encounter (Signed)
  Chief Complaint: Low Heartrate Symptoms: None Frequency: Lasted 10 per smart watch Pertinent Negatives: Patient denies dizziness, HA, vision changes Disposition: [x] ED /[] Urgent Care (no appt availability in office) / [] Appointment(In office/virtual)/ []  [facility]/ [] Home Care/ [] Refused Recommended Disposition /[] [facility]/ []  Follow-up with PCP Additional Notes: Pt reports her Apple Watch notified her that her heart rate was in the 40s and remained there for 10 minutes, pt reports she had just finished a 15 minute walk around the house. Pt reports her heart rate does not usually run low, she denies symptoms including, dizziness, HA, vision changes, fever. Pt advised to proceed to ED as upon checking the pt's heart rate remains in the 40s. Pt agreeable and husband to drive her. This RN educated pt on home care, new-worsening symptoms, when to call back/seek emergent care. Pt verbalized understanding and agrees to plan.     Copied from CRM [id number]. Topic: Clinical - Red Word Triage >> May 09, 2023 10:09 AM Deaijah H wrote: Red Word that prompted transfer to Nurse Triage: Heart rate 40 beats per minute. Reason for Disposition  Heart beating very slowly (e.g., < 50 / minute)  (Exception: Athlete and heart rate normal for caller.)  Answer Assessment - Initial Assessment Questions 1. DESCRIPTION: "Please describe your heart rate or heartbeat that you are having" (e.g., fast/slow, regular/irregular, skipped or extra beats, "palpitations")     40 BPM 2. ONSET: "When did it start?" (Minutes, hours or days)      Apple watch notified pt HR was at 40 BPM for 10 minutes 3. DURATION: "How long does it last" (e.g., seconds, minutes, hours)     10 min per watch 4. PATTERN "Does it come and go, or has it been constant since it started?"  "Does it get worse with exertion?"   "Are you feeling it now?"     Remaining low 6. HEART RATE: "Can you tell me your heart rate?" "How many  beats in 15 seconds?"  (Note: not all patients can do this)       43 per watch 7. RECURRENT SYMPTOM: "Have you ever had this before?" If Yes, ask: "When was the last time?" and "What happened that time?"      No 8. CAUSE: "What do you think is causing the palpitations?"     Unknown, pt was taking a walk for 15 min prior 9. CARDIAC HISTORY: "Do you have any history of heart disease?" (e.g., heart attack, angina, bypass surgery, angioplasty, arrhythmia)      None 10. OTHER SYMPTOMS: "Do you have any other symptoms?" (e.g., dizziness, chest pain, sweating, difficulty breathing)       None  Protocols used: Heart Rate and Heartbeat Questions-A-AH

## 2023-05-25 ENCOUNTER — Telehealth: Admitting: Family Medicine

## 2023-05-25 DIAGNOSIS — N390 Urinary tract infection, site not specified: Secondary | ICD-10-CM

## 2023-05-25 MED ORDER — NITROFURANTOIN MONOHYD MACRO 100 MG PO CAPS: 100.0000 mg | ORAL_CAPSULE | Freq: Two times a day (BID) | ORAL | 0 refills | Status: AC

## 2023-05-25 NOTE — Patient Instructions (Signed)

## 2023-05-25 NOTE — Progress Notes (Signed)
 Virtual Visit Consent   Kelly Ramsey, you are scheduled for a virtual visit with a [facility] provider today. Just as with appointments in the office, your consent must be obtained to participate. Your consent will be active for this visit and any virtual visit you may have with one of our providers in the next 365 days. If you have a MyChart account, a copy of this consent can be sent to you electronically.  As this is a virtual visit, video technology does not allow for your provider to perform a traditional examination. This may limit your provider's ability to fully assess your condition. If your provider identifies any concerns that need to be evaluated in person or the need to arrange testing (such as labs, EKG, etc.), we will make arrangements to do so. Although advances in technology are sophisticated, we cannot ensure that it will always work on either your end or our end. If the connection with a video visit is poor, the visit may have to be switched to a telephone visit. With either a video or telephone visit, we are not always able to ensure that we have a secure connection.  By engaging in this virtual visit, you consent to the provision of healthcare and authorize for your insurance to be billed (if applicable) for the services provided during this visit. Depending on your insurance coverage, you may receive a charge related to this service.  I need to obtain your verbal consent now. Are you willing to proceed with your visit today? Kelly Ramsey has provided verbal consent on 05/25/2023 for a virtual visit (video or telephone). Kelly Curio, FNP  Date: 05/25/2023 8:20 AM   Virtual Visit via Video Note   I, Kelly Ramsey, connected with  Kelly Ramsey  ([id number], 05/28/1980) on 05/25/23 at  8:15 AM EST by a video-enabled telemedicine application and verified that I am speaking with the correct person using two identifiers.  Location: Patient: Virtual Visit  Location Patient: Home Provider: Virtual Visit Location Provider: Home Office   I discussed the limitations of evaluation and management by telemedicine and the availability of in person appointments. The patient expressed understanding and agreed to proceed.    History of Present Illness: Kelly Ramsey is a 43 y.o. who identifies as a female who was assigned female at birth, and is being seen today for burning and frequency on urination. Sx for 1 day. She is having more dryness with sexual activity. No fever. In no distress. Marland Kitchen  HPI: HPI  Problems:  Patient Active Problem List   Diagnosis Date Noted   Vitamin B12 deficiency 08/01/2021   Vitamin D deficiency 08/01/2021   Thyroid nodule 08/01/2021   KNEE PAIN, BILATERAL 03/01/2010   Pes planus 03/01/2010   WEIGHT GAIN 03/01/2010    Allergies:  Allergies  Allergen Reactions   Acetaminophen Itching    In her throat   Penicillins Other (See Comments) and Rash   Medications:  Current Outpatient Medications:    cyanocobalamin (VITAMIN B12) 1000 MCG/ML injection, Inject 1 mL (1,000 mcg total) into the muscle every 30 (thirty) days., Disp: 3 mL, Rfl: 3   meloxicam (MOBIC) 15 MG tablet, TAKE 1 TABLET (15 MG TOTAL) BY MOUTH DAILY. (Patient not taking: Reported on 08/03/2022), Disp: 30 tablet, Rfl: 0   NEEDLE, DISP, 25 G (B-D DISP NEEDLE 25GX1") 25G X 1" MISC, Use every 30 days with B 12 injection, Disp: 50 each, Rfl: 0   Syringe/Needle, Disp, (SYRINGE 3CC/25GX1") 25G X 1" 3 ML  MISC, 1 each by Does not apply route every 30 (thirty) days. (Patient not taking: Reported on 08/03/2022), Disp: 10 each, Rfl: 1   VITAMIN D PO, Take 50,000 Units by mouth every 30 (thirty) days. (Patient not taking: Reported on 08/03/2022), Disp: , Rfl:    Vitamin D, Ergocalciferol, (DRISDOL) 1.25 MG (50000 UNIT) CAPS capsule, Take 1 capsule (50,000 Units total) by mouth every 30 (thirty) days., Disp: 8 capsule, Rfl: 0  Observations/Objective: Patient is  well-developed, well-nourished in no acute distress.  Resting comfortably  at home.  Head is normocephalic, atraumatic.  No labored breathing.  Speech is clear and coherent with logical content.  Patient is alert and oriented at baseline.   Assessment and Plan: 1. Urinary tract infection without hematuria, site unspecified (Primary)  Increase fluids, preventative measures discussed, UC if sx worsen.   Follow Up Instructions: I discussed the assessment and treatment plan with the patient. The patient was provided an opportunity to ask questions and all were answered. The patient agreed with the plan and demonstrated an understanding of the instructions.  A copy of instructions were sent to the patient via MyChart unless otherwise noted below.     The patient was advised to call back or seek an in-person evaluation if the symptoms worsen or if the condition fails to improve as anticipated.    Kelly Curio, FNP

## 2023-06-18 DIAGNOSIS — Z1231 Encounter for screening mammogram for malignant neoplasm of breast: Secondary | ICD-10-CM | POA: Diagnosis not present

## 2023-06-21 ENCOUNTER — Other Ambulatory Visit: Payer: Self-pay | Admitting: Obstetrics & Gynecology

## 2023-06-21 DIAGNOSIS — R928 Other abnormal and inconclusive findings on diagnostic imaging of breast: Secondary | ICD-10-CM

## 2023-07-03 ENCOUNTER — Other Ambulatory Visit

## 2023-07-03 ENCOUNTER — Encounter

## 2023-07-16 DIAGNOSIS — R6884 Jaw pain: Secondary | ICD-10-CM | POA: Diagnosis not present

## 2023-07-25 ENCOUNTER — Encounter (HOSPITAL_COMMUNITY): Payer: Self-pay

## 2023-08-01 ENCOUNTER — Encounter

## 2023-08-01 ENCOUNTER — Other Ambulatory Visit

## 2023-08-08 ENCOUNTER — Ambulatory Visit (INDEPENDENT_AMBULATORY_CARE_PROVIDER_SITE_OTHER): Payer: BC Managed Care – PPO | Admitting: Family Medicine

## 2023-08-08 ENCOUNTER — Encounter: Payer: Self-pay | Admitting: Family Medicine

## 2023-08-08 ENCOUNTER — Ambulatory Visit: Payer: Self-pay | Admitting: Family Medicine

## 2023-08-08 VITALS — BP 103/76 | HR 61 | Temp 98.1°F | Resp 18 | Ht 64.0 in | Wt 189.5 lb

## 2023-08-08 DIAGNOSIS — Z0000 Encounter for general adult medical examination without abnormal findings: Secondary | ICD-10-CM | POA: Diagnosis not present

## 2023-08-08 DIAGNOSIS — R635 Abnormal weight gain: Secondary | ICD-10-CM | POA: Diagnosis not present

## 2023-08-08 DIAGNOSIS — E538 Deficiency of other specified B group vitamins: Secondary | ICD-10-CM

## 2023-08-08 DIAGNOSIS — Z13220 Encounter for screening for lipoid disorders: Secondary | ICD-10-CM

## 2023-08-08 DIAGNOSIS — E559 Vitamin D deficiency, unspecified: Secondary | ICD-10-CM | POA: Diagnosis not present

## 2023-08-08 LAB — LIPID PANEL
Cholesterol: 179 mg/dL (ref 0–200)
HDL: 60.7 mg/dL (ref >39.00)
LDL Cholesterol: 103 mg/dL — ABNORMAL HIGH (ref 0–99)
NonHDL: 118.47
Total CHOL/HDL Ratio: 3
Triglycerides: 76 mg/dL (ref 0.0–149.0)
VLDL: 15.2 mg/dL (ref 0.0–40.0)

## 2023-08-08 LAB — COMPREHENSIVE METABOLIC PANEL WITH GFR
ALT: 14 U/L (ref 0–35)
AST: 15 U/L (ref 0–37)
Albumin: 4.4 g/dL (ref 3.5–5.2)
Alkaline Phosphatase: 84 U/L (ref 39–117)
BUN: 12 mg/dL (ref 6–23)
CO2: 27 meq/L (ref 19–32)
Calcium: 9.3 mg/dL (ref 8.4–10.5)
Chloride: 102 meq/L (ref 96–112)
Creatinine, Ser: 0.77 mg/dL (ref 0.40–1.20)
GFR: 94.51 mL/min (ref >60.00)
Glucose, Bld: 88 mg/dL (ref 70–99)
Potassium: 4 meq/L (ref 3.5–5.1)
Sodium: 137 meq/L (ref 135–145)
Total Bilirubin: 0.7 mg/dL (ref 0.2–1.2)
Total Protein: 7.3 g/dL (ref 6.0–8.3)

## 2023-08-08 LAB — VITAMIN D 25 HYDROXY (VIT D DEFICIENCY, FRACTURES): VITD: 16.22 ng/mL — ABNORMAL LOW (ref 30.00–100.00)

## 2023-08-08 LAB — CBC WITH DIFFERENTIAL/PLATELET
Basophils Absolute: 0.1 10*3/uL (ref 0.0–0.1)
Basophils Relative: 0.9 % (ref 0.0–3.0)
Eosinophils Absolute: 0.1 10*3/uL (ref 0.0–0.7)
Eosinophils Relative: 1.1 % (ref 0.0–5.0)
HCT: 38.8 % (ref 36.0–46.0)
Hemoglobin: 12.9 g/dL (ref 12.0–15.0)
Lymphocytes Relative: 25.2 % (ref 12.0–46.0)
Lymphs Abs: 1.5 10*3/uL (ref 0.7–4.0)
MCHC: 33.1 g/dL (ref 30.0–36.0)
MCV: 83.4 fl (ref 78.0–100.0)
Monocytes Absolute: 0.4 10*3/uL (ref 0.1–1.0)
Monocytes Relative: 6.2 % (ref 3.0–12.0)
Neutro Abs: 3.8 10*3/uL (ref 1.4–7.7)
Neutrophils Relative %: 66.6 % (ref 43.0–77.0)
Platelets: 229 10*3/uL (ref 150.0–400.0)
RBC: 4.66 Mil/uL (ref 3.87–5.11)
RDW: 14 % (ref 11.5–15.5)
WBC: 5.7 10*3/uL (ref 4.0–10.5)

## 2023-08-08 LAB — HEMOGLOBIN A1C: Hgb A1c MFr Bld: 5.5 % (ref 4.6–6.5)

## 2023-08-08 LAB — VITAMIN B12: Vitamin B-12: 241 pg/mL (ref 211–911)

## 2023-08-08 LAB — TSH: TSH: 1.46 u[IU]/mL (ref 0.35–5.50)

## 2023-08-08 NOTE — Patient Instructions (Signed)

## 2023-08-08 NOTE — Progress Notes (Signed)
 Phone [phone]   Subjective:   Patient is a 43 y.o. female presenting for annual physical.    Chief Complaint  Patient presents with   Annual Exam    CPE Fasting Concerned with weight gain    Annual Discussed the use of AI scribe software for clinical note transcription with the patient, who gave verbal consent to proceed.  History of Present Illness Kelly Ramsey is a 43 year old female who presents for a routine follow-up.  Her thyroid  has shrunk slightly and is not causing any trouble with swallowing. She is currently under management for her thyroid  issues-Dr. Ronelle Coffee  She experiences blurry vision, particularly with close-up tasks, and uses reading glasses to help with this issue. Her vision is fine for the first few hours without glasses, but prolonged use of glasses makes her vision more blurry. She has not had a recent eye checkup. No major headaches, dizziness, or double vision.  She mentions an episode where her Apple watch recorded a heart rate below 40, but she did not feel dizzy or lightheaded at the time. She attributes this to possibly wearing a knee brace that day. She has not experienced any further episodes. No chest pains, heart racing, or skipping beats.  She reports occasional constipation, which she associates with her diet, particularly when she does not consume enough vegetables. She tries to drink at least two and a half liters of water daily. No vomiting or diarrhea.  She is in the premenopausal stage and has noticed a gradual weight gain of about ten pounds over the past year, she does not exercise. She follows a mostly vegetarian diet and consumes about 100 grams of rice per day. No depression.  B12 and D deficiency-taking supplements    See problem oriented charting- ROS- ROS: Gen: no fever, chills  Skin: no rash, itching ENT: no ear pain, ear drainage, nasal congestion, rhinorrhea, sinus pressure, sore throat Eyes: no blurry vision,  double vision Resp: no cough, wheeze,SOB CV: no CP, palpitations, LE edema,  GI: no heartburn, n/v/d/c, abd pain GU: no dysuria, urgency, frequency, hematuria MSK: no joint pain, myalgias, back pain Neuro: no dizziness, headache, weakness, vertigo Psych: no depression, anxiety, insomnia, SI  Has a lot of lipomas.  Not changing  The following were reviewed and entered/updated in epic: Past Medical History:  Diagnosis Date   B12 deficiency    Fibroids    Thyroid  nodule    Vitamin D  deficiency    Patient Active Problem List   Diagnosis Date Noted   Vitamin B12 deficiency 08/01/2021   Vitamin D  deficiency 08/01/2021   Thyroid  nodule 08/01/2021   KNEE PAIN, BILATERAL 03/01/2010   Pes planus 03/01/2010   WEIGHT GAIN 03/01/2010   Past Surgical History:  Procedure Laterality Date   BIOPSY ENDOMETRIAL  2023   polyp   CESAREAN SECTION  2009   GUM SURGERY  2022   TUBAL LIGATION      Family History  Problem Relation Age of Onset   Hypertension Mother    Diabetes Mother    Arthritis Mother    Hypertension Father    Diabetes Father    Cancer Paternal Grandfather     Medications- reviewed and updated Current Outpatient Medications  Medication Sig Dispense Refill   cyanocobalamin  (VITAMIN B12) 1000 MCG/ML injection Inject 1 mL (1,000 mcg total) into the muscle every 30 (thirty) days. 3 mL 3   NEEDLE, DISP, 25 G (B-D DISP NEEDLE 25GX1") 25G X 1" MISC Use every 30  days with B 12 injection 50 each 0   Vitamin D , Ergocalciferol , (DRISDOL ) 1.25 MG (50000 UNIT) CAPS capsule Take 1 capsule (50,000 Units total) by mouth every 30 (thirty) days. 8 capsule 0   No current facility-administered medications for this visit.    Allergies-reviewed and updated Allergies  Allergen Reactions   Acetaminophen  Itching    In her throat   Penicillins Other (See Comments), Rash and Dermatitis    Social History   Social History Narrative   Air cabin crew   Objective  Objective:  BP  103/76   Pulse 61   Temp 98.1 F (36.7 C) (Temporal)   Resp 18   Ht 5\' 4"  (1.626 m)   Wt 189 lb 8 oz (86 kg)   LMP 05/06/2022 (Exact Date)   SpO2 100%   BMI 32.53 kg/m  Physical Exam  Gen: WDWN NAD HEENT: NCAT, conjunctiva not injected, sclera nonicteric TM WNL B, OP moist, no exudates  NECK:  supple,+ R thyromegaly, no nodes, no carotid bruits CARDIAC: RRR, S1S2+, no murmur. DP 2+B LUNGS: CTAB. No wheezes ABDOMEN:  BS+, soft, NTND, No HSM, no masses EXT:  no edema MSK: no gross abnormalities. MS 5/5 all 4 NEURO: A&O x3.  CN II-XII intact.  PSYCH: normal mood. Good eye contact   Lumps arms-no changes    Assessment and Plan   Health Maintenance counseling: 1. Anticipatory guidance: Patient counseled regarding regular dental exams q6 months, eye exams,  avoiding smoking and second hand smoke, limiting alcohol to 1 beverage per day, no illicit drugs.   2. Risk factor reduction:  Advised patient of need for regular exercise and diet rich and fruits and vegetables to reduce risk of heart attack and stroke. Exercise- encouraged.  Wt Readings from Last 3 Encounters:  08/08/23 189 lb 8 oz (86 kg)  08/03/22 183 lb 8 oz (83.2 kg)  07/05/22 183 lb (83 kg)   3. Immunizations/screenings/ancillary studies Immunization History  Administered Date(s) Administered   Influenza Inj Mdck Quad Pf 05/05/2015   Influenza,inj,quad, With Preservative 12/24/2018   Influenza-Unspecified 05/05/2015, 12/24/2018   PFIZER(Purple Top)SARS-COV-2 Vaccination 06/05/2019, 06/26/2019   Tdap 11/18/2009, 03/05/2012, 07/28/2015   Unspecified SARS-COV-2 Vaccination 06/05/2019, 06/26/2019, 02/18/2020   Health Maintenance Due  Topic Date Due   Cervical Cancer Screening (HPV/Pap Cotest)  03/11/2013    4. Cervical cancer screening- utd 5. Breast cancer screening-  mammogram sch June 6. Colon cancer screening - n/a 7. Skin cancer screening- advised regular sunscreen use. Denies worrisome, changing, or new  skin lesions.  8. Birth control/STD check- TL 9. Osteoporosis screening- n/a 10. Smoking associated screening - non smoker  Wellness examination -     CBC with Differential/Platelet -     Comprehensive metabolic panel with GFR -     Lipid panel -     TSH -     Hemoglobin A1c -     Vitamin B12 -     VITAMIN D  25 Hydroxy (Vit-D Deficiency, Fractures)  Vitamin B12 deficiency -     Vitamin B12  Vitamin D  deficiency -     VITAMIN D  25 Hydroxy (Vit-D Deficiency, Fractures)  WEIGHT GAIN   Wellness-anticipatory guidance.  Work on Diet/Exercise  Check CBC,CMP,lipids,TSH, A1C.  F/u 1 yr  Assessment and Plan Assessment & Plan Thyroid  nodule   The thyroid  nodule has reduced in size and is not causing dysphagia. She is considering a new injection treatment to further shrink the nodule. Continue follow-up with Dr. Maye Speak for thyroid  management  and consider the new injection treatment.  Perimenopause   Perimenopause is confirmed by OB-GYN. She experiences weight gain and frequent UTIs when not adequately hydrated. Emphasize exercise and dietary modifications to manage weight and symptoms, including reducing starches and sugars. Increase physical activity, aiming for 30 minutes of cardiovascular exercise five days a week. Monitor dietary intake, reducing starches and sugars, and maintain adequate hydration to prevent UTIs.  Constipation   Intermittent constipation may be related to a diet lacking in vegetables and adequate water intake. She reports drinking two and a half liters of water daily. Increase dietary fiber intake, including more vegetables, and maintain adequate hydration.  Presbyopia   Blurry vision when reading close-up is consistent with presbyopia. She uses reading glasses and experiences blurriness when switching between distances. Consider visiting an ophthalmologist for a comprehensive eye exam and consider multifocal lenses for better accommodation between distances.  Wt  gain-discussed diet/exercise B and D deficiency-check labs.  Continue supplements    Recommended follow up: Return in about 1 year (around 08/07/2024) for annual physical.  Lab/Order associations:+ fasting  Ellsworth Haas, MD

## 2023-08-08 NOTE — Progress Notes (Signed)
 Labs great except 1.  D still low-needs to increase by 2000iu/d 2.  B12 still low-if taking regularly, may need to try sublingual B 12 or consider injections

## 2023-08-09 ENCOUNTER — Telehealth: Payer: Self-pay | Admitting: *Deleted

## 2023-08-09 ENCOUNTER — Other Ambulatory Visit: Payer: Self-pay | Admitting: Family Medicine

## 2023-08-09 MED ORDER — CYANOCOBALAMIN 1000 MCG/ML IJ SOLN: 1000.0000 ug | INTRAMUSCULAR | 4 refills | Status: AC

## 2023-08-09 MED ORDER — VITAMIN D (ERGOCALCIFEROL) 1.25 MG (50000 UNIT) PO CAPS: 50000.0000 [IU] | ORAL_CAPSULE | ORAL | 3 refills | Status: AC

## 2023-08-09 NOTE — Telephone Encounter (Signed)
 Copied from CRM [id number]. Topic: Clinical - Lab/Test Results >> Aug 09, 2023 11:33 AM Chrystal Crape R wrote: Pt returning call in regards to labs. She's aware of lab results however need additional assistance ash she's already taking injections.  Patient stated she is already taking monthly B 12 injections, what does she need to do. Also, what dose of vitamin d  should she take, was taking the weekly vitamin d .

## 2023-08-10 ENCOUNTER — Encounter: Payer: Self-pay | Admitting: *Deleted

## 2023-08-10 NOTE — Telephone Encounter (Signed)
 Patient notified of message below.

## 2023-08-30 ENCOUNTER — Ambulatory Visit
Admission: RE | Admit: 2023-08-30 | Discharge: 2023-08-30 | Disposition: A | Discharge status: Home / Self Care | Source: Ambulatory Visit | Attending: Obstetrics & Gynecology | Admitting: Obstetrics & Gynecology

## 2023-08-30 ENCOUNTER — Ambulatory Visit

## 2023-08-30 DIAGNOSIS — R928 Other abnormal and inconclusive findings on diagnostic imaging of breast: Secondary | ICD-10-CM | POA: Diagnosis not present

## 2023-09-03 ENCOUNTER — Other Ambulatory Visit: Payer: Self-pay | Admitting: Obstetrics & Gynecology

## 2023-09-03 DIAGNOSIS — N6489 Other specified disorders of breast: Secondary | ICD-10-CM

## 2024-03-07 ENCOUNTER — Encounter

## 2024-08-08 ENCOUNTER — Encounter: Admitting: Family Medicine
